# Patient Record
Sex: Male | Born: 1945 | Race: White | Hispanic: No | Marital: Married | Smoking: Former smoker
Health system: Southern US, Community
[De-identification: ages and names within clinical notes are randomized; demographics above are authoritative.]

## PROBLEM LIST (undated history)

## (undated) DIAGNOSIS — Z860100 Personal history of colon polyps, unspecified: Secondary | ICD-10-CM

## (undated) DIAGNOSIS — K219 Gastro-esophageal reflux disease without esophagitis: Secondary | ICD-10-CM

## (undated) DIAGNOSIS — E785 Hyperlipidemia, unspecified: Secondary | ICD-10-CM

## (undated) HISTORY — PX: LAPAROSCOPIC INGUINAL HERNIA REPAIR: SUR788

## (undated) HISTORY — PX: HERNIA REPAIR: SHX51

## (undated) HISTORY — DX: Hyperlipidemia, unspecified: E78.5

## (undated) HISTORY — PX: TOOTH EXTRACTION: SUR596

## (undated) HISTORY — PX: OTHER SURGICAL HISTORY: SHX169

## (undated) HISTORY — PX: CYST EXCISION: SHX5701

---

## 2008-10-01 ENCOUNTER — Ambulatory Visit: Payer: Self-pay | Admitting: Gastroenterology

## 2012-08-10 HISTORY — PX: COLONOSCOPY: SHX174

## 2014-01-19 DIAGNOSIS — Z86010 Personal history of colonic polyps: Secondary | ICD-10-CM | POA: Insufficient documentation

## 2014-02-13 ENCOUNTER — Ambulatory Visit: Payer: Self-pay | Admitting: Gastroenterology

## 2014-02-13 LAB — HM COLONOSCOPY

## 2014-02-14 LAB — PATHOLOGY REPORT

## 2014-03-27 LAB — LIPID PANEL
Cholesterol: 174 mg/dL (ref 0–200)
HDL: 85 mg/dL — AB (ref 35–70)
LDL Cholesterol: 77 mg/dL
Triglycerides: 62 mg/dL (ref 40–160)

## 2014-03-27 LAB — BASIC METABOLIC PANEL
BUN: 11 mg/dL (ref 4–21)
CREATININE: 1 mg/dL (ref <=1.3)
Glucose: 84 mg/dL

## 2014-03-27 LAB — PSA: PSA: 4.5

## 2014-05-16 DIAGNOSIS — E291 Testicular hypofunction: Secondary | ICD-10-CM | POA: Insufficient documentation

## 2014-05-16 DIAGNOSIS — N138 Other obstructive and reflux uropathy: Secondary | ICD-10-CM | POA: Insufficient documentation

## 2014-05-16 DIAGNOSIS — N401 Enlarged prostate with lower urinary tract symptoms: Secondary | ICD-10-CM | POA: Insufficient documentation

## 2014-12-21 DIAGNOSIS — Z0000 Encounter for general adult medical examination without abnormal findings: Secondary | ICD-10-CM | POA: Insufficient documentation

## 2014-12-21 DIAGNOSIS — R7401 Elevation of levels of liver transaminase levels: Secondary | ICD-10-CM | POA: Insufficient documentation

## 2014-12-21 DIAGNOSIS — R030 Elevated blood-pressure reading, without diagnosis of hypertension: Secondary | ICD-10-CM | POA: Insufficient documentation

## 2014-12-21 DIAGNOSIS — E785 Hyperlipidemia, unspecified: Secondary | ICD-10-CM | POA: Insufficient documentation

## 2014-12-21 DIAGNOSIS — R740 Nonspecific elevation of levels of transaminase and lactic acid dehydrogenase [LDH]: Secondary | ICD-10-CM

## 2014-12-21 DIAGNOSIS — N529 Male erectile dysfunction, unspecified: Secondary | ICD-10-CM | POA: Insufficient documentation

## 2015-03-29 ENCOUNTER — Ambulatory Visit (INDEPENDENT_AMBULATORY_CARE_PROVIDER_SITE_OTHER): Payer: Medicare Other | Admitting: Family Medicine

## 2015-03-29 ENCOUNTER — Encounter: Payer: Self-pay | Admitting: Family Medicine

## 2015-03-29 VITALS — BP 120/70 | HR 60 | Ht 72.0 in | Wt 197.0 lb

## 2015-03-29 DIAGNOSIS — Z0000 Encounter for general adult medical examination without abnormal findings: Secondary | ICD-10-CM | POA: Diagnosis not present

## 2015-03-29 DIAGNOSIS — R740 Nonspecific elevation of levels of transaminase and lactic acid dehydrogenase [LDH]: Secondary | ICD-10-CM | POA: Diagnosis not present

## 2015-03-29 DIAGNOSIS — J301 Allergic rhinitis due to pollen: Secondary | ICD-10-CM | POA: Diagnosis not present

## 2015-03-29 DIAGNOSIS — E785 Hyperlipidemia, unspecified: Secondary | ICD-10-CM

## 2015-03-29 DIAGNOSIS — R7402 Elevation of levels of lactic acid dehydrogenase (LDH): Secondary | ICD-10-CM

## 2015-03-29 DIAGNOSIS — R030 Elevated blood-pressure reading, without diagnosis of hypertension: Secondary | ICD-10-CM

## 2015-03-29 LAB — POCT URINALYSIS DIPSTICK
Bilirubin, UA: NEGATIVE
Blood, UA: NEGATIVE
GLUCOSE UA: NEGATIVE
Ketones, UA: NEGATIVE
Leukocytes, UA: NEGATIVE
NITRITE UA: NEGATIVE
PROTEIN UA: NEGATIVE
Spec Grav, UA: 1.01
UROBILINOGEN UA: 0.2
pH, UA: 6

## 2015-03-29 LAB — HEMOCCULT GUIAC POC 1CARD (OFFICE): FECAL OCCULT BLD: NEGATIVE

## 2015-03-29 MED ORDER — FISH OIL 1000 MG PO CAPS: 1.0000 | ORAL_CAPSULE | Freq: Every day | ORAL | Status: AC

## 2015-03-29 MED ORDER — FLUTICASONE PROPIONATE 50 MCG/ACT NA SUSP: 1.0000 | Freq: Every day | NASAL | Status: DC

## 2015-03-29 NOTE — Progress Notes (Signed)
Name: Richard Stafford   MRN: [id number]    DOB: 12/15/1945   Date:03/29/2015       Progress Note  Subjective  Chief Complaint  Chief Complaint  Patient presents with  . Annual Exam    question about Flonase    HPI Comments: No specific concerns objectively nor subjectly.     No problem-specific assessment & plan notes found for this encounter.   Past Medical History  Diagnosis Date  . Hyperlipidemia     Past Surgical History  Procedure Laterality Date  . Hernia repair    . Varicose veins    . Colonoscopy  2014    cleared for 5 years- [facility]    Family History  Problem Relation Age of Onset  . Cancer Father   . Heart disease Father   . Cancer Brother     Social History   Social History  . Marital Status: Married    Spouse Name: N/A  . Number of Children: N/A  . Years of Education: N/A   Occupational History  . Not on file.   Social History Main Topics  . Smoking status: Former Research scientist (life sciences)  . Smokeless tobacco: Not on file  . Alcohol Use: 0.0 oz/week    0 Standard drinks or equivalent per week  . Drug Use: No  . Sexual Activity: Yes   Other Topics Concern  . Not on file   Social History Narrative    No Known Allergies   Review of Systems  Constitutional: Negative for fever, chills, weight loss and malaise/fatigue.  HENT: Negative for ear discharge, ear pain and sore throat.   Eyes: Negative for blurred vision.  Respiratory: Negative for cough, sputum production, shortness of breath and wheezing.   Cardiovascular: Negative for chest pain, palpitations and leg swelling.  Gastrointestinal: Negative for heartburn, nausea, abdominal pain, diarrhea, constipation, blood in stool and melena.  Genitourinary: Negative for dysuria, urgency, frequency and hematuria.  Musculoskeletal: Negative for myalgias, back pain, joint pain and neck pain.  Skin: Negative for rash.  Neurological: Negative for dizziness, tingling, sensory change, focal weakness and headaches.   Endo/Heme/Allergies: Negative for environmental allergies and polydipsia. Does not bruise/bleed easily.  Psychiatric/Behavioral: Negative for depression and suicidal ideas. The patient is not nervous/anxious and does not have insomnia.      Objective  Filed Vitals:   03/29/15 0826  BP: 120/70  Pulse: 60  Height: 6' (1.829 m)  Weight: 197 lb (89.359 kg)    Physical Exam  Constitutional: He is oriented to person, place, and time and well-developed, well-nourished, and in no distress.  HENT:  Head: Normocephalic.  Right Ear: External ear normal.  Left Ear: External ear normal.  Nose: Nose normal.  Mouth/Throat: Oropharynx is clear and moist.  Eyes: Conjunctivae and EOM are normal. Pupils are equal, round, and reactive to light. Right eye exhibits no discharge. Left eye exhibits no discharge. No scleral icterus.  Neck: Normal range of motion. Neck supple. No JVD present. No tracheal deviation present. No thyromegaly present.  Cardiovascular: Normal rate, regular rhythm, normal heart sounds and intact distal pulses.  Exam reveals no gallop and no friction rub.   No murmur heard. Pulmonary/Chest: Breath sounds normal. No respiratory distress. He has no wheezes. He has no rales.  Abdominal: Soft. Bowel sounds are normal. He exhibits no mass. There is no hepatosplenomegaly. There is no tenderness. There is no rebound, no guarding and no CVA tenderness.  Genitourinary: Rectum normal and prostate normal. Guaiac negative stool.  Musculoskeletal: Normal range of motion. He exhibits no edema or tenderness.  Lymphadenopathy:    He has no cervical adenopathy.  Neurological: He is alert and oriented to person, place, and time. He has normal sensation, normal strength, normal reflexes and intact cranial nerves. No cranial nerve deficit.  Skin: Skin is warm. No rash noted.  Psychiatric: Mood and affect normal.      Assessment & Plan  Problem List Items Addressed This Visit    None     Visit Diagnoses    Annual physical exam    -  Primary    Allergic rhinitis due to pollen             Dr. Otilio Miu [facility]  03/29/2015

## 2015-03-30 LAB — RENAL FUNCTION PANEL
ALBUMIN: 4.8 g/dL (ref 3.6–4.8)
BUN / CREAT RATIO: 11 (ref 10–22)
BUN: 11 mg/dL (ref 8–27)
CALCIUM: 9.3 mg/dL (ref 8.6–10.2)
CO2: 25 mmol/L (ref 18–29)
CREATININE: 1.01 mg/dL (ref 0.76–1.27)
Chloride: 96 mmol/L — ABNORMAL LOW (ref 97–108)
GFR calc Af Amer: 89 mL/min/{1.73_m2} (ref >59)
GFR calc non Af Amer: 77 mL/min/{1.73_m2} (ref >59)
Glucose: 71 mg/dL (ref 65–99)
Phosphorus: 3.6 mg/dL (ref 2.5–4.5)
Potassium: 4.5 mmol/L (ref 3.5–5.2)
Sodium: 141 mmol/L (ref 134–144)

## 2015-03-30 LAB — LIPID PANEL
CHOLESTEROL TOTAL: 166 mg/dL (ref 100–199)
Chol/HDL Ratio: 1.9 ratio units (ref 0.0–5.0)
HDL: 89 mg/dL (ref >39)
LDL CALC: 65 mg/dL (ref 0–99)
TRIGLYCERIDES: 60 mg/dL (ref 0–149)
VLDL CHOLESTEROL CAL: 12 mg/dL (ref 5–40)

## 2015-05-17 DIAGNOSIS — R339 Retention of urine, unspecified: Secondary | ICD-10-CM | POA: Insufficient documentation

## 2015-07-01 ENCOUNTER — Ambulatory Visit (INDEPENDENT_AMBULATORY_CARE_PROVIDER_SITE_OTHER): Payer: Medicare Other | Admitting: Family Medicine

## 2015-07-01 ENCOUNTER — Encounter: Payer: Self-pay | Admitting: Family Medicine

## 2015-07-01 VITALS — BP 120/68 | HR 64 | Ht 72.0 in | Wt 208.0 lb

## 2015-07-01 DIAGNOSIS — S46112A Strain of muscle, fascia and tendon of long head of biceps, left arm, initial encounter: Secondary | ICD-10-CM | POA: Diagnosis not present

## 2015-07-01 NOTE — Progress Notes (Signed)
Name: Richard Stafford   MRN: [id number]    DOB: [birth year]   Date:07/01/2015       Progress Note  Subjective  Chief Complaint  Chief Complaint  Patient presents with  . Fall    fell approx 11 days ago on ship- arm pain when trying to do things such as twist the arm or put shoes on    Fall The accident occurred more than 1 week ago. The fall occurred while standing (slippage of arm). He landed on hard floor. There was no blood loss. The point of impact was the left elbow. The pain is present in the left elbow and left upper arm. The pain is severe. The symptoms are aggravated by flexion and rotation. Pertinent negatives include no abdominal pain, fever, headaches, hematuria, nausea, numbness or tingling. Associated symptoms comments: Weakness noted. He has tried NSAID and immobilization for the symptoms. The treatment provided no relief.    No problem-specific assessment & plan notes found for this encounter.   Past Medical History  Diagnosis Date  . Hyperlipidemia     Past Surgical History  Procedure Laterality Date  . Hernia repair    . Varicose veins    . Colonoscopy  2014    cleared for 5 years- [facility]    Family History  Problem Relation Age of Onset  . Cancer Father   . Heart disease Father   . Cancer Brother     Social History   Social History  . Marital Status: Married    Spouse Name: N/A  . Number of Children: N/A  . Years of Education: N/A   Occupational History  . Not on file.   Social History Main Topics  . Smoking status: Former Research scientist (life sciences)  . Smokeless tobacco: Not on file  . Alcohol Use: 0.0 oz/week    0 Standard drinks or equivalent per week  . Drug Use: No  . Sexual Activity: Yes   Other Topics Concern  . Not on file   Social History Narrative    No Known Allergies   Review of Systems  Constitutional: Negative for fever, chills, weight loss and malaise/fatigue.  HENT: Negative for ear discharge, ear pain and sore throat.   Eyes: Negative  for blurred vision.  Respiratory: Negative for cough, sputum production, shortness of breath and wheezing.   Cardiovascular: Negative for chest pain, palpitations and leg swelling.  Gastrointestinal: Negative for heartburn, nausea, abdominal pain, diarrhea, constipation, blood in stool and melena.  Genitourinary: Negative for dysuria, urgency, frequency and hematuria.  Musculoskeletal: Negative for myalgias, back pain, joint pain and neck pain.  Skin: Negative for rash.  Neurological: Positive for focal weakness. Negative for dizziness, tingling, sensory change, numbness and headaches.  Endo/Heme/Allergies: Negative for environmental allergies and polydipsia. Does not bruise/bleed easily.  Psychiatric/Behavioral: Negative for depression and suicidal ideas. The patient is not nervous/anxious and does not have insomnia.      Objective  Filed Vitals:   07/01/15 0759  BP: 120/68  Pulse: 64  Height: 6' (1.829 m)  Weight: 208 lb (94.348 kg)    Physical Exam  Constitutional: He is oriented to person, place, and time and well-developed, well-nourished, and in no distress.  HENT:  Head: Normocephalic.  Right Ear: External ear normal.  Left Ear: External ear normal.  Nose: Nose normal.  Mouth/Throat: Oropharynx is clear and moist.  Eyes: Conjunctivae and EOM are normal. Pupils are equal, round, and reactive to light. Right eye exhibits no discharge. Left eye exhibits  no discharge. No scleral icterus.  Neck: Normal range of motion. Neck supple. No JVD present. No tracheal deviation present. No thyromegaly present.  Cardiovascular: Normal rate, regular rhythm, normal heart sounds and intact distal pulses.  Exam reveals no gallop and no friction rub.   No murmur heard. Pulmonary/Chest: Breath sounds normal. No respiratory distress. He has no wheezes. He has no rales.  Abdominal: Soft. Bowel sounds are normal. He exhibits no mass. There is no hepatosplenomegaly. There is no tenderness. There is  no rebound, no guarding and no CVA tenderness.  Musculoskeletal: Normal range of motion. He exhibits no edema.       Left elbow: He exhibits deformity. Tenderness found.       Arms: Lymphadenopathy:    He has no cervical adenopathy.  Neurological: He is alert and oriented to person, place, and time. He has normal sensation, normal reflexes and intact cranial nerves. He displays weakness. No cranial nerve deficit.  Decrease flexion/supination left / defect noted left bicep  Skin: Skin is warm. No rash noted.  Psychiatric: Mood and affect normal.  Nursing note and vitals reviewed.     Assessment & Plan  Problem List Items Addressed This Visit    None    Visit Diagnoses    Biceps tendon tear, left, initial encounter    -  Primary    Relevant Orders    Ambulatory referral to Orthopedic Surgery         Dr. Otilio Miu [facility] [place]  07/01/2015

## 2016-01-21 DIAGNOSIS — H2513 Age-related nuclear cataract, bilateral: Secondary | ICD-10-CM | POA: Diagnosis not present

## 2016-03-30 ENCOUNTER — Encounter: Payer: Medicare Other | Admitting: Family Medicine

## 2016-04-01 ENCOUNTER — Ambulatory Visit (INDEPENDENT_AMBULATORY_CARE_PROVIDER_SITE_OTHER): Payer: Medicare Other | Admitting: Family Medicine

## 2016-04-01 ENCOUNTER — Encounter: Payer: Self-pay | Admitting: Family Medicine

## 2016-04-01 VITALS — BP 120/80 | HR 64 | Ht 72.0 in | Wt 212.0 lb

## 2016-04-01 DIAGNOSIS — E669 Obesity, unspecified: Secondary | ICD-10-CM | POA: Diagnosis not present

## 2016-04-01 DIAGNOSIS — Z0000 Encounter for general adult medical examination without abnormal findings: Secondary | ICD-10-CM | POA: Diagnosis not present

## 2016-04-01 DIAGNOSIS — N400 Enlarged prostate without lower urinary tract symptoms: Secondary | ICD-10-CM

## 2016-04-01 DIAGNOSIS — R351 Nocturia: Secondary | ICD-10-CM

## 2016-04-01 LAB — POCT URINALYSIS DIPSTICK
BILIRUBIN UA: NEGATIVE
Blood, UA: NEGATIVE
Glucose, UA: NEGATIVE
KETONES UA: NEGATIVE
LEUKOCYTES UA: NEGATIVE
Nitrite, UA: NEGATIVE
PROTEIN UA: NEGATIVE
Spec Grav, UA: 1.01
Urobilinogen, UA: 0.2
pH, UA: 5

## 2016-04-01 NOTE — Progress Notes (Signed)
Name: Richard Stafford   MRN: [id number]    DOB: 1946-01-20   Date:04/01/2016       Progress Note  Subjective  Chief Complaint  Chief Complaint  Patient presents with  . Annual Exam    Patient presents for physical exam.    No problem-specific Assessment & Plan notes found for this encounter.   Past Medical History:  Diagnosis Date  . Hyperlipidemia     Past Surgical History:  Procedure Laterality Date  . COLONOSCOPY  2014   cleared for 5 years- [facility] docs  . HERNIA REPAIR    . varicose veins      Family History  Problem Relation Age of Onset  . Cancer Father   . Heart disease Father   . Cancer Brother     Social History   Social History  . Marital status: Married    Spouse name: N/A  . Number of children: N/A  . Years of education: N/A   Occupational History  . Not on file.   Social History Main Topics  . Smoking status: Former Research scientist (life sciences)  . Smokeless tobacco: Not on file  . Alcohol use 0.0 oz/week  . Drug use: No  . Sexual activity: Yes   Other Topics Concern  . Not on file   Social History Narrative  . No narrative on file    No Known Allergies   Review of Systems  Constitutional: Negative for chills, fever, malaise/fatigue and weight loss.  HENT: Negative for ear discharge, ear pain and sore throat.   Eyes: Negative for blurred vision.  Respiratory: Negative for cough, sputum production, shortness of breath and wheezing.   Cardiovascular: Negative for chest pain, palpitations and leg swelling.  Gastrointestinal: Negative for abdominal pain, blood in stool, constipation, diarrhea, heartburn, melena and nausea.  Genitourinary: Negative for dysuria, frequency, hematuria and urgency.  Musculoskeletal: Negative for back pain, joint pain, myalgias and neck pain.  Skin: Negative for rash.  Neurological: Negative for dizziness, tingling, sensory change, focal weakness and headaches.  Endo/Heme/Allergies: Negative for environmental allergies and  polydipsia. Does not bruise/bleed easily.  Psychiatric/Behavioral: Negative for depression and suicidal ideas. The patient is not nervous/anxious and does not have insomnia.      Objective  Vitals:   04/01/16 0824  BP: 120/80  Pulse: 64  Weight: 212 lb (96.2 kg)  Height: 6' (1.829 m)    Physical Exam  Constitutional: He is oriented to person, place, and time and well-developed, well-nourished, and in no distress.  HENT:  Head: Normocephalic.  Right Ear: External ear normal.  Left Ear: External ear normal.  Nose: Nose normal.  Mouth/Throat: Oropharynx is clear and moist.  Eyes: Conjunctivae and EOM are normal. Pupils are equal, round, and reactive to light. Right eye exhibits no discharge. Left eye exhibits no discharge. No scleral icterus.  Neck: Normal range of motion. Neck supple. No JVD present. No tracheal deviation present. No thyromegaly present.  Cardiovascular: Normal rate, regular rhythm, S1 normal, S2 normal, normal heart sounds and intact distal pulses.  Exam reveals no gallop, no S3, no S4 and no friction rub.   No murmur heard. Pulmonary/Chest: Breath sounds normal. No respiratory distress. He has no wheezes. He has no rales. Right breast exhibits no mass. Left breast exhibits no mass.  Abdominal: Soft. Bowel sounds are normal. He exhibits no mass. There is no hepatosplenomegaly. There is no tenderness. There is no rebound, no guarding and no CVA tenderness.  Genitourinary: Rectum normal, prostate normal, testes/scrotum normal  and penis normal.  Musculoskeletal: Normal range of motion. He exhibits no edema or tenderness.  Lymphadenopathy:    He has no cervical adenopathy.  Neurological: He is alert and oriented to person, place, and time. He has normal sensation, normal strength, normal reflexes and intact cranial nerves. No cranial nerve deficit.  Skin: Skin is warm. No rash noted.  Psychiatric: Mood and affect normal.  Nursing note and vitals  reviewed.     Assessment & Plan  Problem List Items Addressed This Visit    None    Visit Diagnoses    Annual physical exam    -  Primary   Relevant Orders   Basic metabolic panel   Lipid Profile   PSA   Nocturia       Relevant Orders   PSA   POCT urinalysis dipstick (Completed)   Obesity       Relevant Orders   Lipid Profile   BPH (benign prostatic hyperplasia)            Dr. Otilio Miu [facility]  04/01/16

## 2016-04-02 LAB — LIPID PANEL
CHOL/HDL RATIO: 2.3 ratio (ref 0.0–5.0)
Cholesterol, Total: 147 mg/dL (ref 100–199)
HDL: 63 mg/dL (ref >39)
LDL CALC: 63 mg/dL (ref 0–99)
Triglycerides: 106 mg/dL (ref 0–149)
VLDL CHOLESTEROL CAL: 21 mg/dL (ref 5–40)

## 2016-04-02 LAB — BASIC METABOLIC PANEL
BUN/Creatinine Ratio: 11 (ref 10–24)
BUN: 11 mg/dL (ref 8–27)
CALCIUM: 9 mg/dL (ref 8.6–10.2)
CO2: 26 mmol/L (ref 18–29)
Chloride: 101 mmol/L (ref 96–106)
Creatinine, Ser: 1.01 mg/dL (ref 0.76–1.27)
GFR, EST AFRICAN AMERICAN: 88 mL/min/{1.73_m2} (ref >59)
GFR, EST NON AFRICAN AMERICAN: 76 mL/min/{1.73_m2} (ref >59)
Glucose: 76 mg/dL (ref 65–99)
POTASSIUM: 5 mmol/L (ref 3.5–5.2)
Sodium: 142 mmol/L (ref 134–144)

## 2016-04-02 LAB — PSA: PROSTATE SPECIFIC AG, SERUM: 5 ng/mL — AB (ref 0.0–4.0)

## 2016-04-10 DIAGNOSIS — N138 Other obstructive and reflux uropathy: Secondary | ICD-10-CM | POA: Diagnosis not present

## 2016-04-10 DIAGNOSIS — E291 Testicular hypofunction: Secondary | ICD-10-CM | POA: Diagnosis not present

## 2016-04-10 DIAGNOSIS — R972 Elevated prostate specific antigen [PSA]: Secondary | ICD-10-CM | POA: Diagnosis not present

## 2016-04-10 DIAGNOSIS — Z6825 Body mass index (BMI) 25.0-25.9, adult: Secondary | ICD-10-CM | POA: Diagnosis not present

## 2016-04-10 DIAGNOSIS — N401 Enlarged prostate with lower urinary tract symptoms: Secondary | ICD-10-CM | POA: Diagnosis not present

## 2016-04-10 DIAGNOSIS — R339 Retention of urine, unspecified: Secondary | ICD-10-CM | POA: Diagnosis not present

## 2016-05-06 DIAGNOSIS — Z23 Encounter for immunization: Secondary | ICD-10-CM | POA: Diagnosis not present

## 2016-05-07 ENCOUNTER — Other Ambulatory Visit: Payer: Self-pay

## 2016-06-30 DIAGNOSIS — N4231 Prostatic intraepithelial neoplasia: Secondary | ICD-10-CM | POA: Diagnosis not present

## 2016-06-30 DIAGNOSIS — R972 Elevated prostate specific antigen [PSA]: Secondary | ICD-10-CM | POA: Diagnosis not present

## 2016-07-08 DIAGNOSIS — Z6825 Body mass index (BMI) 25.0-25.9, adult: Secondary | ICD-10-CM | POA: Diagnosis not present

## 2016-07-08 DIAGNOSIS — D075 Carcinoma in situ of prostate: Secondary | ICD-10-CM | POA: Insufficient documentation

## 2016-07-08 DIAGNOSIS — R972 Elevated prostate specific antigen [PSA]: Secondary | ICD-10-CM | POA: Diagnosis not present

## 2016-07-08 DIAGNOSIS — N138 Other obstructive and reflux uropathy: Secondary | ICD-10-CM | POA: Diagnosis not present

## 2016-07-08 DIAGNOSIS — N401 Enlarged prostate with lower urinary tract symptoms: Secondary | ICD-10-CM | POA: Diagnosis not present

## 2016-08-11 ENCOUNTER — Telehealth: Payer: Self-pay

## 2016-08-11 NOTE — Telephone Encounter (Signed)
Pt called to ask for [employer] to take on vacation with him- was told we could do it this time, but would not be able to continue due to this not being good medicine practice

## 2016-08-14 ENCOUNTER — Other Ambulatory Visit: Payer: Self-pay

## 2016-08-14 DIAGNOSIS — R05 Cough: Secondary | ICD-10-CM

## 2016-08-14 DIAGNOSIS — R059 Cough, unspecified: Secondary | ICD-10-CM

## 2016-08-14 MED ORDER — AZITHROMYCIN 250 MG PO TABS: 250.0000 mg | ORAL_TABLET | Freq: Every day | ORAL | 0 refills | Status: DC

## 2016-10-07 DIAGNOSIS — R339 Retention of urine, unspecified: Secondary | ICD-10-CM | POA: Diagnosis not present

## 2016-10-07 DIAGNOSIS — Z6825 Body mass index (BMI) 25.0-25.9, adult: Secondary | ICD-10-CM | POA: Diagnosis not present

## 2016-10-07 DIAGNOSIS — R972 Elevated prostate specific antigen [PSA]: Secondary | ICD-10-CM | POA: Diagnosis not present

## 2016-10-07 DIAGNOSIS — N401 Enlarged prostate with lower urinary tract symptoms: Secondary | ICD-10-CM | POA: Diagnosis not present

## 2016-10-07 DIAGNOSIS — D075 Carcinoma in situ of prostate: Secondary | ICD-10-CM | POA: Diagnosis not present

## 2016-10-07 DIAGNOSIS — N138 Other obstructive and reflux uropathy: Secondary | ICD-10-CM | POA: Diagnosis not present

## 2017-02-05 DIAGNOSIS — R339 Retention of urine, unspecified: Secondary | ICD-10-CM | POA: Diagnosis not present

## 2017-02-05 DIAGNOSIS — Z6825 Body mass index (BMI) 25.0-25.9, adult: Secondary | ICD-10-CM | POA: Diagnosis not present

## 2017-02-05 DIAGNOSIS — R972 Elevated prostate specific antigen [PSA]: Secondary | ICD-10-CM | POA: Diagnosis not present

## 2017-02-05 DIAGNOSIS — N138 Other obstructive and reflux uropathy: Secondary | ICD-10-CM | POA: Diagnosis not present

## 2017-02-05 DIAGNOSIS — D075 Carcinoma in situ of prostate: Secondary | ICD-10-CM | POA: Diagnosis not present

## 2017-02-05 DIAGNOSIS — N401 Enlarged prostate with lower urinary tract symptoms: Secondary | ICD-10-CM | POA: Diagnosis not present

## 2017-04-02 ENCOUNTER — Ambulatory Visit (INDEPENDENT_AMBULATORY_CARE_PROVIDER_SITE_OTHER): Payer: Medicare Other | Admitting: Family Medicine

## 2017-04-02 ENCOUNTER — Encounter: Payer: Self-pay | Admitting: Family Medicine

## 2017-04-02 VITALS — BP 124/72 | HR 64 | Ht 72.0 in | Wt 233.0 lb

## 2017-04-02 DIAGNOSIS — Z1159 Encounter for screening for other viral diseases: Secondary | ICD-10-CM | POA: Insufficient documentation

## 2017-04-02 DIAGNOSIS — S90512A Abrasion, left ankle, initial encounter: Secondary | ICD-10-CM | POA: Diagnosis not present

## 2017-04-02 DIAGNOSIS — E785 Hyperlipidemia, unspecified: Secondary | ICD-10-CM

## 2017-04-02 DIAGNOSIS — Z0000 Encounter for general adult medical examination without abnormal findings: Secondary | ICD-10-CM | POA: Diagnosis not present

## 2017-04-02 DIAGNOSIS — Z23 Encounter for immunization: Secondary | ICD-10-CM

## 2017-04-02 DIAGNOSIS — H6123 Impacted cerumen, bilateral: Secondary | ICD-10-CM

## 2017-04-02 NOTE — Progress Notes (Signed)
Name: Richard Stafford   MRN: [id number]    DOB: Dec 10, 1945   Date:04/02/2017       Progress Note  Subjective  Chief Complaint  Chief Complaint  Patient presents with  . Annual Exam    needs TDAP and Prevnar 13    Patient presents for annual physical exam.   Rash  This is a new problem. The current episode started in the past 7 days. The problem is unchanged. The affected locations include the left ankle. The rash is characterized by redness. Pertinent negatives include no cough, diarrhea, fever, joint pain, shortness of breath or sore throat.    No problem-specific Assessment & Plan notes found for this encounter.   Past Medical History:  Diagnosis Date  . Hyperlipidemia     Past Surgical History:  Procedure Laterality Date  . COLONOSCOPY  2014   cleared for 5 years- [facility] docs  . HERNIA REPAIR    . varicose veins      Family History  Problem Relation Age of Onset  . Cancer Father   . Heart disease Father   . Cancer Brother     Social History   Social History  . Marital status: Married    Spouse name: N/A  . Number of children: N/A  . Years of education: N/A   Occupational History  . Not on file.   Social History Main Topics  . Smoking status: Former Research scientist (life sciences)  . Smokeless tobacco: Never Used  . Alcohol use 0.0 oz/week  . Drug use: No  . Sexual activity: Yes   Other Topics Concern  . Not on file   Social History Narrative  . No narrative on file    No Known Allergies  Outpatient Medications Prior to Visit  Medication Sig Dispense Refill  . Ascorbic Acid (VITAMIN C) 500 MG CAPS Take 1 capsule by mouth daily.    Marland Kitchen aspirin 81 MG tablet Take 1 tablet by mouth daily.    . fluticasone (FLONASE) 50 MCG/ACT nasal spray Place 1 spray into both nostrils daily. 1 g 11  . Glucosamine 500 MG CAPS Take 2 capsules by mouth once.     . Multiple Vitamins-Minerals (MENS MULTIVITAMIN PLUS PO) Take 1 tablet by mouth daily.    . Omega-3 Fatty Acids (FISH OIL) 1000 MG CAPS  Take 1 capsule (1,000 mg total) by mouth daily. 30 capsule 11  . azithromycin (ZITHROMAX) 250 MG tablet Take 1 tablet (250 mg total) by mouth daily. Take 2 tabs on day one at once and then take 1 daily until finished. 6 tablet 0   No facility-administered medications prior to visit.     Review of Systems  Constitutional: Negative for chills, fever, malaise/fatigue and weight loss.  HENT: Negative for ear discharge, ear pain and sore throat.   Eyes: Negative for blurred vision.  Respiratory: Negative for cough, sputum production, shortness of breath and wheezing.   Cardiovascular: Negative for chest pain, palpitations and leg swelling.  Gastrointestinal: Negative for abdominal pain, blood in stool, constipation, diarrhea, heartburn, melena and nausea.  Genitourinary: Negative for dysuria, frequency, hematuria and urgency.  Musculoskeletal: Negative for back pain, joint pain, myalgias and neck pain.  Skin: Negative for rash.  Neurological: Negative for dizziness, tingling, sensory change, focal weakness and headaches.  Endo/Heme/Allergies: Negative for environmental allergies and polydipsia. Does not bruise/bleed easily.  Psychiatric/Behavioral: Negative for depression and suicidal ideas. The patient is not nervous/anxious and does not have insomnia.      Objective  Vitals:   04/02/17 0823  BP: 124/72  Pulse: 64  Weight: 233 lb (105.7 kg)  Height: 6' (1.829 m)    Physical Exam  Constitutional: He is oriented to person, place, and time and well-developed, well-nourished, and in no distress. Vital signs are normal.  HENT:  Head: Normocephalic.  Right Ear: External ear normal.  Left Ear: External ear normal.  Nose: Nose normal.  Mouth/Throat: Uvula is midline, oropharynx is clear and moist and mucous membranes are normal. No oropharyngeal exudate, posterior oropharyngeal edema or posterior oropharyngeal erythema.  Eyes: Pupils are equal, round, and reactive to light. Conjunctivae  and EOM are normal. Right eye exhibits no discharge. Left eye exhibits no discharge. No scleral icterus.  Fundoscopic exam:      The right eye shows no arteriolar narrowing and no AV nicking.       The left eye shows no arteriolar narrowing and no AV nicking.  Neck: Trachea normal and normal range of motion. Neck supple. Normal carotid pulses, no hepatojugular reflux and no JVD present. Carotid bruit is not present. No tracheal deviation present. No thyroid mass and no thyromegaly present.  Cardiovascular: Normal rate, regular rhythm, S1 normal, normal heart sounds and intact distal pulses.  PMI is not displaced.  Exam reveals no gallop, no S3, no S4, no distant heart sounds and no friction rub.   No murmur heard. Pulmonary/Chest: Breath sounds normal. No respiratory distress. He has no wheezes. He has no rales. Right breast exhibits no mass. Left breast exhibits no mass.  Abdominal: Soft. Normal aorta and bowel sounds are normal. He exhibits no mass. There is no hepatosplenomegaly. There is no tenderness. There is no rebound, no guarding and no CVA tenderness.  Genitourinary: Rectum normal, prostate normal and testes/scrotum normal. Rectal exam shows no external hemorrhoid and guaiac negative stool. No discharge found.  Musculoskeletal: Normal range of motion. He exhibits no edema or tenderness.       Left elbow: He exhibits deformity.       Cervical back: Normal.       Thoracic back: Normal.       Lumbar back: Normal.  Lymphadenopathy:       Head (right side): No submandibular adenopathy present.       Head (left side): No submandibular adenopathy present.    He has no cervical adenopathy.  Neurological: He is alert and oriented to person, place, and time. He has normal sensation, normal strength, normal reflexes and intact cranial nerves. No cranial nerve deficit.  Skin: Skin is warm and intact. No rash noted.  abasion trauma  Psychiatric: Mood and affect normal.  Nursing note and vitals  reviewed.     Assessment & Plan  Problem List Items Addressed This Visit      Other   HLD (hyperlipidemia)   Relevant Medications   sildenafil (REVATIO) 20 MG tablet   Other Relevant Orders   Lipid Profile   Need for hepatitis C screening test   Relevant Orders   Hepatitis C antibody    Other Visit Diagnoses    Annual physical exam    -  Primary   Relevant Orders   Renal Function Panel   Need for vaccination with 13-polyvalent pneumococcal conjugate vaccine       Relevant Orders   Pneumococcal conjugate vaccine 13-valent IM (Completed)   Abrasion of left ankle, initial encounter       Need for diphtheria-tetanus-pertussis (Tdap) vaccine       Relevant Orders  Tdap vaccine greater than or equal to 7yo IM (Completed)   Bilateral impacted cerumen          No orders of the defined types were placed in this encounter.     Dr. Mac[facility]  04/02/17

## 2017-04-03 LAB — RENAL FUNCTION PANEL
ALBUMIN: 4.4 g/dL (ref 3.6–4.8)
BUN/Creatinine Ratio: 16 (ref 10–24)
BUN: 16 mg/dL (ref 8–27)
CHLORIDE: 99 mmol/L (ref 96–106)
CO2: 24 mmol/L (ref 20–29)
CREATININE: 1.02 mg/dL (ref 0.76–1.27)
Calcium: 9.3 mg/dL (ref 8.6–10.2)
GFR, EST AFRICAN AMERICAN: 86 mL/min/{1.73_m2} (ref >59)
GFR, EST NON AFRICAN AMERICAN: 75 mL/min/{1.73_m2} (ref >59)
Glucose: 93 mg/dL (ref 65–99)
Phosphorus: 3.6 mg/dL (ref 2.5–4.5)
Potassium: 4.9 mmol/L (ref 3.5–5.2)
Sodium: 138 mmol/L (ref 134–144)

## 2017-04-03 LAB — LIPID PANEL
Chol/HDL Ratio: 2.8 ratio (ref 0.0–5.0)
Cholesterol, Total: 163 mg/dL (ref 100–199)
HDL: 58 mg/dL (ref >39)
LDL Calculated: 93 mg/dL (ref 0–99)
Triglycerides: 60 mg/dL (ref 0–149)
VLDL CHOLESTEROL CAL: 12 mg/dL (ref 5–40)

## 2017-04-03 LAB — HEPATITIS C ANTIBODY

## 2017-04-19 DIAGNOSIS — J341 Cyst and mucocele of nose and nasal sinus: Secondary | ICD-10-CM | POA: Diagnosis not present

## 2017-06-03 DIAGNOSIS — Z23 Encounter for immunization: Secondary | ICD-10-CM | POA: Diagnosis not present

## 2017-06-24 DIAGNOSIS — D3131 Benign neoplasm of right choroid: Secondary | ICD-10-CM | POA: Diagnosis not present

## 2017-10-20 ENCOUNTER — Ambulatory Visit: Payer: Medicare Other | Admitting: Family Medicine

## 2017-10-20 ENCOUNTER — Telehealth: Payer: Self-pay

## 2017-10-20 ENCOUNTER — Encounter: Payer: Self-pay | Admitting: Family Medicine

## 2017-10-20 VITALS — BP 130/82 | HR 64 | Ht 72.0 in | Wt 241.0 lb

## 2017-10-20 DIAGNOSIS — D229 Melanocytic nevi, unspecified: Secondary | ICD-10-CM

## 2017-10-20 DIAGNOSIS — J219 Acute bronchiolitis, unspecified: Secondary | ICD-10-CM | POA: Diagnosis not present

## 2017-10-20 MED ORDER — DOXYCYCLINE HYCLATE 100 MG PO TABS: 100.0000 mg | ORAL_TABLET | Freq: Two times a day (BID) | ORAL | 0 refills | Status: DC

## 2017-10-20 NOTE — Telephone Encounter (Signed)
Called pt to sched AWV w/ [facility]. Scheduled to be seen 10/21/17.

## 2017-10-20 NOTE — Progress Notes (Signed)
Name: Richard Stafford   MRN: [id number]    DOB: September 28, 1945   Date:10/20/2017       Progress Note  Subjective  Chief Complaint  Chief Complaint  Patient presents with  . Nevus    located on L) collar bone x 1 month- dark gray, gotten bigger. 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 Patient has noted a change in size (vertical and circumferencial.)   Cough  This is a new (on cruise) problem. The current episode started 1 to 4 weeks ago. The problem has been gradually worsening. The cough is productive of purulent sputum (thick/cream). Associated symptoms include nasal congestion and postnasal drip. Pertinent negatives include no chest pain, chills, ear congestion, ear pain, fever, headaches, heartburn, hemoptysis, myalgias, rash, rhinorrhea, sore throat, shortness of breath, sweats, weight loss or wheezing. The symptoms are aggravated by pollens. The treatment provided moderate relief. There is no history of COPD, emphysema or environmental allergies.    No problem-specific Assessment & Plan notes found for this encounter.   Past Medical History:  Diagnosis Date  . Hyperlipidemia     Past Surgical History:  Procedure Laterality Date  . COLONOSCOPY  2014   cleared for 5 years- [facility] docs  . HERNIA REPAIR    . varicose veins      Family History  Problem Relation Age of Onset  . Cancer Father   . Heart disease Father   . Cancer Brother     Social History   Socioeconomic History  . Marital status: Married    Spouse name: Not on file  . Number of children: Not on file  . Years of education: Not on file  . Highest education level: Not on file  Social Needs  . Financial resource strain: Not on file  . Food insecurity - worry: Not on file  . Food insecurity - inability: Not on file  . Transportation needs - medical: Not on file  . Transportation needs - non-medical: Not on file  Occupational History  . Not on file  Tobacco Use  .  Smoking status: Former Research scientist (life sciences)  . Smokeless tobacco: Never Used  Substance and Sexual Activity  . Alcohol use: Yes    Alcohol/week: 0.0 oz  . Drug use: No  . Sexual activity: Yes  Other Topics Concern  . Not on file  Social History Narrative  . Not on file    No Known Allergies  Outpatient Medications Prior to Visit  Medication Sig Dispense Refill  . Ascorbic Acid (VITAMIN C) 500 MG CAPS Take 1 capsule by mouth daily.    Marland Kitchen aspirin 81 MG tablet Take 1 tablet by mouth daily.    . finasteride (PROSCAR) 5 MG tablet Take 5 mg by mouth daily. [facility]/ [facility]    . fluticasone (FLONASE) 50 MCG/ACT nasal spray Place 1 spray into both nostrils daily. 1 g 11  . Glucosamine 500 MG CAPS Take 2 capsules by mouth once.     . Multiple Vitamins-Minerals (MENS MULTIVITAMIN PLUS PO) Take 1 tablet by mouth daily.    . Omega-3 Fatty Acids (FISH OIL) 1000 MG CAPS Take 1 capsule (1,000 mg total) by mouth daily. 30 capsule 11  . sildenafil (REVATIO) 20 MG tablet Urology/ [facility]     No facility-administered medications prior to visit.     Review of Systems  Constitutional: Negative for chills, fever, malaise/fatigue and weight loss.  HENT: Positive for postnasal drip. Negative  for ear discharge, ear pain, rhinorrhea and sore throat.   Eyes: Negative for blurred vision.  Respiratory: Negative for cough, hemoptysis, sputum production, shortness of breath and wheezing.   Cardiovascular: Negative for chest pain, palpitations and leg swelling.  Gastrointestinal: Negative for abdominal pain, blood in stool, constipation, diarrhea, heartburn, melena and nausea.  Genitourinary: Negative for dysuria, frequency, hematuria and urgency.  Musculoskeletal: Negative for back pain, joint pain, myalgias and neck pain.  Skin: Negative for rash.  Neurological: Negative for dizziness, tingling, sensory change, focal weakness and headaches.  Endo/Heme/Allergies: Negative for environmental allergies and polydipsia. Does not  bruise/bleed easily.  Psychiatric/Behavioral: Negative for depression and suicidal ideas. The patient is not nervous/anxious and does not have insomnia.      Objective  Vitals:   10/20/17 1111  BP: 130/82  Pulse: 64  Weight: 241 lb (109.3 kg)  Height: 6' (1.829 m)    Physical Exam  Constitutional: He is oriented to person, place, and time and well-developed, well-nourished, and in no distress.  HENT:  Head: Normocephalic.  Right Ear: External ear normal.  Left Ear: External ear normal.  Nose: Nose normal.  Mouth/Throat: Oropharynx is clear and moist.  Eyes: Conjunctivae and EOM are normal. Pupils are equal, round, and reactive to light. Right eye exhibits no discharge. Left eye exhibits no discharge. No scleral icterus.  Neck: Normal range of motion. Neck supple. No JVD present. No tracheal deviation present. No thyromegaly present.  Cardiovascular: Normal rate, regular rhythm, normal heart sounds and intact distal pulses. Exam reveals no gallop and no friction rub.  No murmur heard. Pulmonary/Chest: Breath sounds normal. No respiratory distress. He has no wheezes. He has no rales.  Abdominal: Soft. Bowel sounds are normal. He exhibits no mass. There is no hepatosplenomegaly. There is no tenderness. There is no rebound, no guarding and no CVA tenderness.  Musculoskeletal: Normal range of motion. He exhibits no edema or tenderness.  Lymphadenopathy:    He has no cervical adenopathy.  Neurological: He is alert and oriented to person, place, and time. He has normal sensation, normal strength, normal reflexes and intact cranial nerves. No cranial nerve deficit.  Skin: Skin is warm. No rash noted.  Maculopapular pigmented with irregular border  Psychiatric: Mood and affect normal.  Nursing note and vitals reviewed.     Assessment & Plan  Problem List Items Addressed This Visit    None    Visit Diagnoses    Bronchiolitis    -  Primary   partially treated   Relevant  Medications   doxycycline (VIBRA-TABS) 100 MG tablet   Nevus       ? 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 doxycycline (VIBRA-TABS) 100 MG tablet    Sig: Take 1 tablet (100 mg total) by mouth 2 (two) times daily.    Dispense:  20 tablet    Refill:  0      Dr. Otilio Miu [facility]  10/20/17

## 2017-10-21 ENCOUNTER — Ambulatory Visit (INDEPENDENT_AMBULATORY_CARE_PROVIDER_SITE_OTHER): Payer: Medicare Other

## 2017-10-21 VITALS — BP 132/70 | HR 60 | Temp 98.5°F | Resp 12 | Ht 72.0 in | Wt 241.2 lb

## 2017-10-21 DIAGNOSIS — Z1211 Encounter for screening for malignant neoplasm of colon: Secondary | ICD-10-CM

## 2017-10-21 DIAGNOSIS — Z0000 Encounter for general adult medical examination without abnormal findings: Secondary | ICD-10-CM | POA: Diagnosis not present

## 2017-10-21 NOTE — Patient Instructions (Signed)
Richard Stafford , Thank you for taking time to come for your Medicare Wellness Visit. I appreciate your ongoing commitment to your health goals. Please review the following plan we discussed and let me know if I can assist you in the future.   Screening recommendations/referrals: Colorectal Screening: Completed colonoscopy 02/20/14. Repeat every 10 years. You will receive a call from our office regarding your appointment.  Vision/Dental Exams: Recommended yearly ophthalmology/optometry visit for glaucoma screening and checkup Recommended yearly dental visit for hygiene and checkup  Vaccinations: Influenza vaccine: Up to date Pneumococcal vaccine: Completed series Tdap vaccine: Up to date Shingles vaccine: Please call your insurance company to determine your out of pocket expense for the Shingrix vaccine. You may also receive this vaccine at your local pharmacy or Health Dept.   Advanced directives: Advance directive discussed with you today. I have provided a copy for you to complete at home and have notarized. Once this is complete please bring a copy in to our office so we can scan it into your chart.  Conditions/risks identified: Recommend to drink at least 6-8 8oz glasses of water per day.  Next appointment: Please schedule your Annual Wellness Visit with your Nurse Health Advisor in one year.  Preventive Care 45 Years and Older, Male Preventive care refers to lifestyle choices and visits with your health care provider that can promote health and wellness. What does preventive care include?  A yearly physical exam. This is also called an annual well check.  Dental exams once or twice a year.  Routine eye exams. Ask your health care provider how often you should have your eyes checked.  Personal lifestyle choices, including:  Daily care of your teeth and gums.  Regular physical activity.  Eating a healthy diet.  Avoiding tobacco and drug use.  Limiting alcohol use.  Practicing  safe sex.  Taking low doses of aspirin every day.  Taking vitamin and mineral supplements as recommended by your health care provider. What happens during an annual well check? The services and screenings done by your health care provider during your annual well check will depend on your age, overall health, lifestyle risk factors, and family history of disease. Counseling  Your health care provider may ask you questions about your:  Alcohol use.  Tobacco use.  Drug use.  Emotional well-being.  Home and relationship well-being.  Sexual activity.  Eating habits.  History of falls.  Memory and ability to understand (cognition).  Work and work Statistician. Screening  You may have the following tests or measurements:  Height, weight, and BMI.  Blood pressure.  Lipid and cholesterol levels. These may be checked every 5 years, or more frequently if you are over 32 years old.  Skin check.  Lung cancer screening. You may have this screening every year starting at age 97 if you have a 30-pack-year history of smoking and currently smoke or have quit within the past 15 years.  Fecal occult blood test (FOBT) of the stool. You may have this test every year starting at age 29.  Flexible sigmoidoscopy or colonoscopy. You may have a sigmoidoscopy every 5 years or a colonoscopy every 10 years starting at age 67.  Prostate cancer screening. Recommendations will vary depending on your family history and other risks.  Hepatitis C blood test.  Hepatitis B blood test.  Sexually transmitted disease (STD) testing.  Diabetes screening. This is done by checking your blood sugar (glucose) after you have not eaten for a while (fasting). You may  have this done every 1-3 years.  Abdominal aortic aneurysm (AAA) screening. You may need this if you are a current or former smoker.  Osteoporosis. You may be screened starting at age 13 if you are at high risk. Talk with your health care  provider about your test results, treatment options, and if necessary, the need for more tests. Vaccines  Your health care provider may recommend certain vaccines, such as:  Influenza vaccine. This is recommended every year.  Tetanus, diphtheria, and acellular pertussis (Tdap, Td) vaccine. You may need a Td booster every 10 years.  Zoster vaccine. You may need this after age 51.  Pneumococcal 13-valent conjugate (PCV13) vaccine. One dose is recommended after age 80.  Pneumococcal polysaccharide (PPSV23) vaccine. One dose is recommended after age 54. Talk to your health care provider about which screenings and vaccines you need and how often you need them. This information is not intended to replace advice given to you by your health care provider. Make sure you discuss any questions you have with your health care provider. Document Released: 08/23/2015 Document Revised: 04/15/2016 Document Reviewed: 05/28/2015 Elsevier Interactive Patient Education  2017 [place] Prevention in the Home Falls can cause injuries. They can happen to people of all ages. There are many things you can do to make your home safe and to help prevent falls. What can I do on the outside of my home?  Regularly fix the edges of walkways and driveways and fix any cracks.  Remove anything that might make you trip as you walk through a door, such as a raised step or threshold.  Trim any bushes or trees on the path to your home.  Use bright outdoor lighting.  Clear any walking paths of anything that might make someone trip, such as rocks or tools.  Regularly check to see if handrails are loose or broken. Make sure that both sides of any steps have handrails.  Any raised decks and porches should have guardrails on the edges.  Have any leaves, snow, or ice cleared regularly.  Use sand or salt on walking paths during winter.  Clean up any spills in your garage right away. This includes oil or grease  spills. What can I do in the bathroom?  Use night lights.  Install grab bars by the toilet and in the tub and shower. Do not use towel bars as grab bars.  Use non-skid mats or decals in the tub or shower.  If you need to sit down in the shower, use a plastic, non-slip stool.  Keep the floor dry. Clean up any water that spills on the floor as soon as it happens.  Remove soap buildup in the tub or shower regularly.  Attach bath mats securely with double-sided non-slip rug tape.  Do not have throw rugs and other things on the floor that can make you trip. What can I do in the bedroom?  Use night lights.  Make sure that you have a light by your bed that is easy to reach.  Do not use any sheets or blankets that are too big for your bed. They should not hang down onto the floor.  Have a firm chair that has side arms. You can use this for support while you get dressed.  Do not have throw rugs and other things on the floor that can make you trip. What can I do in the kitchen?  Clean up any spills right away.  Avoid walking on wet  floors.  Keep items that you use a lot in easy-to-reach places.  If you need to reach something above you, use a strong step stool that has a grab bar.  Keep electrical cords out of the way.  Do not use floor polish or wax that makes floors slippery. If you must use wax, use non-skid floor wax.  Do not have throw rugs and other things on the floor that can make you trip. What can I do with my stairs?  Do not leave any items on the stairs.  Make sure that there are handrails on both sides of the stairs and use them. Fix handrails that are broken or loose. Make sure that handrails are as long as the stairways.  Check any carpeting to make sure that it is firmly attached to the stairs. Fix any carpet that is loose or worn.  Avoid having throw rugs at the top or bottom of the stairs. If you do have throw rugs, attach them to the floor with carpet  tape.  Make sure that you have a light switch at the top of the stairs and the bottom of the stairs. If you do not have them, ask someone to add them for you. What else can I do to help prevent falls?  Wear shoes that:  Do not have high heels.  Have rubber bottoms.  Are comfortable and fit you well.  Are closed at the toe. Do not wear sandals.  If you use a stepladder:  Make sure that it is fully opened. Do not climb a closed stepladder.  Make sure that both sides of the stepladder are locked into place.  Ask someone to hold it for you, if possible.  Clearly mark and make sure that you can see:  Any grab bars or handrails.  First and last steps.  Where the edge of each step is.  Use tools that help you move around (mobility aids) if they are needed. These include:  Canes.  Walkers.  Scooters.  Crutches.  Turn on the lights when you go into a dark area. Replace any light bulbs as soon as they burn out.  Set up your furniture so you have a clear path. Avoid moving your furniture around.  If any of your floors are uneven, fix them.  If there are any pets around you, be aware of where they are.  Review your medicines with your doctor. Some medicines can make you feel dizzy. This can increase your chance of falling. Ask your doctor what other things that you can do to help prevent falls. This information is not intended to replace advice given to you by your health care provider. Make sure you discuss any questions you have with your health care provider. Document Released: 05/23/2009 Document Revised: 01/02/2016 Document Reviewed: 08/31/2014 Elsevier Interactive Patient Education  2017 [employer].

## 2017-10-21 NOTE — Progress Notes (Signed)
Subjective:   Richard Stafford is a 72 y.o. male who presents for Medicare Annual/Subsequent preventive examination.  Review of Systems:  N/A Cardiac Risk Factors include: advanced age (>27men, >100 women);dyslipidemia;hypertension;male gender;obesity (BMI >30kg/m2)     Objective:    Vitals: BP 132/70 (BP Location: Right Arm, Patient Position: Sitting, Cuff Size: Normal)   Pulse 60   Temp 98.5 F (36.9 C) (Oral)   Resp 12   Ht 6' (1.829 m)   Wt 241 lb 3.2 oz (109.4 kg)   SpO2 92%   BMI 32.71 kg/m   Body mass index is 32.71 kg/m.  Advanced Directives 10/21/2017 07/01/2015 03/29/2015  Does Patient Have a Medical Advance Directive? No No No  Would patient like information on creating a medical advance directive? Yes (MAU/Ambulatory/Procedural Areas - Information given) No - patient declined information -    Tobacco Social History   Tobacco Use  Smoking Status Former Smoker  . Packs/day: 1.00  . Years: 4.00  . Pack years: 4.00  . Types: Cigarettes  . Last attempt to quit: 1988  . Years since quitting: 31.2  Smokeless Tobacco Never Used  Tobacco Comment   smoking cessation materials not required     Counseling given: No Comment: smoking cessation materials not required   Clinical Intake:  Pre-visit preparation completed: Yes  Pain : No/denies pain   BMI - recorded: 32.71 Nutritional Status: BMI > 30  Obese Nutritional Risks: None Diabetes: No  How often do you need to have someone help you when you read instructions, pamphlets, or other written materials from your doctor or pharmacy?: 1 - Never  Interpreter Needed?: No  Information entered by :: AEversole, LPN  Past Medical History:  Diagnosis Date  . Hyperlipidemia    Past Surgical History:  Procedure Laterality Date  . COLONOSCOPY  2014   cleared for 5 years- [facility] docs  . HERNIA REPAIR    . TOOTH EXTRACTION    . varicose veins     Family History  Problem Relation Age of Onset  . Cancer Father   .  Heart disease Father   . Cancer Brother   . Cataracts Mother    Social History   Socioeconomic History  . Marital status: Married    Spouse name: None  . Number of children: 2  . Years of education: None  . Highest education level: Master's degree (e.g., MA, MS, MEng, MEd, MSW, MBA)  Social Needs  . Financial resource strain: Not hard at all  . Food insecurity - worry: Never true  . Food insecurity - inability: Never true  . Transportation needs - medical: No  . Transportation needs - non-medical: No  Occupational History  . Occupation: Retired  Tobacco Use  . Smoking status: Former Smoker    Packs/day: 1.00    Years: 4.00    Pack years: 4.00    Types: Cigarettes    Last attempt to quit: 1988    Years since quitting: 31.2  . Smokeless tobacco: Never Used  . Tobacco comment: smoking cessation materials not required  Substance and Sexual Activity  . Alcohol use: Yes    Alcohol/week: 7.8 oz    Types: 6 Cans of beer, 7 Glasses of wine per week  . Drug use: No  . Sexual activity: Yes  Other Topics Concern  . None  Social History Narrative  . None    Outpatient Encounter Medications as of 10/21/2017  Medication Sig  . Ascorbic Acid (VITAMIN C) 500  MG CAPS Take 1 capsule by mouth daily.  Marland Kitchen aspirin 81 MG tablet Take 1 tablet by mouth daily.  Marland Kitchen doxycycline (VIBRA-TABS) 100 MG tablet Take 1 tablet (100 mg total) by mouth 2 (two) times daily.  . finasteride (PROSCAR) 5 MG tablet Take 5 mg by mouth daily. Urology/ [facility]  . fluticasone (FLONASE) 50 MCG/ACT nasal spray Place 1 spray into both nostrils daily.  . Glucosamine 500 MG CAPS Take 2 capsules by mouth once.   . Multiple Vitamins-Minerals (MENS MULTIVITAMIN PLUS PO) Take 1 tablet by mouth daily.  . Omega-3 Fatty Acids (FISH OIL) 1000 MG CAPS Take 1 capsule (1,000 mg total) by mouth daily.  . sildenafil (REVATIO) 20 MG tablet Urology/ [facility]   No facility-administered encounter medications on file as of 10/21/2017.      Activities of Daily Living In your present state of health, do you have any difficulty performing the following activities: 10/21/2017  Hearing? N  Comment denies hearing aids; tinnitus  Vision? N  Comment wears eyeglasses  Difficulty concentrating or making decisions? N  Walking or climbing stairs? Y  Comment R knee pain  Dressing or bathing? N  Doing errands, shopping? N  Preparing Food and eating ? N  Comment denies dentures  Using the Toilet? N  In the past six months, have you accidently leaked urine? N  Do you have problems with loss of bowel control? N  Managing your Medications? N  Managing your Finances? N  Housekeeping or managing your Housekeeping? N  Some recent data might be hidden    Patient Care Team: Juline Patch, MD as PCP - General (Family Medicine)   Assessment:   This is a routine wellness examination for Richard Stafford.  Exercise Activities and Dietary recommendations Current Exercise Habits: Structured exercise class, Type of exercise: strength training/weights, Time (Minutes): > 60, Frequency (Times/Week): 3, Weekly Exercise (Minutes/Week): 0, Intensity: Mild  Goals    . DIET - INCREASE WATER INTAKE     Recommend to drink at least 6-8 8oz glasses of water per day.       Fall Risk Fall Risk  10/21/2017 04/02/2017 07/01/2015 03/29/2015  Falls in the past year? No No Yes No  Number falls in past yr: - - 1 -  Injury with Fall? - - Yes -  Comment - - here today for eval from fall -   Is the patient's home free of loose throw rugs in walkways, pet beds, electrical cords, etc?   Yes Does the patient have any grab bars in the bathroom? No  Does the patient use a shower chair when bathing? Yes Does the patient have any stairs in or around the home? Yes If so, are there any handrails?  Yes Does the patient have adequate lighting?  Yes Does the patient use a cane, walker or w/c? No Does the patient use of an elevated toilet seat? No  Timed Get Up and Go  Performed: Yes. Pt ambulated 10 feet within 7 sec. Gait stead-fast and without the use of an assistive device. No intervention required at this time. Fall risk prevention has been discussed.  Pt declined my offer to send Community Resource Referral to Care Guide for  installation of grab bars in the shower, shower chair or an elevated toilet seat.  Depression Screen PHQ 2/9 Scores 10/21/2017 10/20/2017 04/02/2017 07/01/2015  PHQ - 2 Score 0 0 0 0  PHQ- 9 Score 0 1 0 -    Cognitive Function  6CIT Screen 10/21/2017  What Year? 0 points  What month? 0 points  What time? 0 points  Count back from 20 0 points  Months in reverse 0 points  Repeat phrase 6 points  Total Score 6    Immunization History  Administered Date(s) Administered  . Influenza Split 06/06/2011  . Influenza-Unspecified 05/10/2017  . Pneumococcal Conjugate-13 04/02/2017  . Pneumococcal Polysaccharide-23 03/27/2014  . Tdap 04/02/2017    Qualifies for Shingles Vaccine? Yes. Due for Zostavax or Shingrix vaccine. Education has been provided regarding the importance of this vaccine. Pt has been advised to call his insurance company to determine his out of pocket expense. Advised he may also receive this vaccine at his local pharmacy or Health Dept. Verbalized acceptance and understanding.  Screening Tests Health Maintenance  Topic Date Due  . COLONOSCOPY  02/14/2024  . TETANUS/TDAP  04/03/2027  . INFLUENZA VACCINE  Completed  . Hepatitis C Screening  Completed  . PNA vac Low Risk Adult  Completed   Cancer Screenings: Lung: Low Dose CT Chest recommended if Age 18-80 years, 30 pack-year currently smoking OR have quit w/in 15years. Patient does not qualify. Colorectal: Completed colonoscopy 02/20/14. Repeat every 10 years. Pt expressed concern about screening every 10 years. Feels screenings should be completed more frequently. Requested to be referred to GI for repeat screening colonoscopy. Ordered referral to GI for  repeat screening colonoscopy. Message sent to referral coordinator for scheduling purposes. Pt aware he will receive a call from our office re: his appt.  Additional Screenings: Hepatitis B/HIV/Syphillis: Does not qualify Hepatitis C Screening: Completed 04/02/17    Plan:  I have personally reviewed and addressed the Medicare Annual Wellness questionnaire and have noted the following in the patient's chart:  A. Medical and social history B. Use of alcohol, tobacco or illicit drugs  C. Current medications and supplements D. Functional ability and status E.  Nutritional status F.  Physical activity G. Advance directives H. List of other physicians I.  Hospitalizations, surgeries, and ER visits in previous 12 months J.  [place] such as hearing and vision if needed, cognitive and depression L. Referrals and appointments - none  In addition, I have reviewed and discussed with patient certain preventive protocols, quality metrics, and best practice recommendations. A written personalized care plan for preventive services as well as general preventive health recommendations were provided to patient.  Signed,  Aleatha Borer, LPN Nurse Health Advisor  MD Recommendations: Due for Zostavax or Shingrix vaccine. Education has been provided regarding the importance of this vaccine. Pt has been advised to call his insurance company to determine his out of pocket expense. Advised he may also receive this vaccine at his local pharmacy or Health Dept. Verbalized acceptance and understanding.  Colorectal screening: Completed colonoscopy 02/20/14. Repeat every 10 years. Pt expressed concern about screening every 10 years. Feels screenings should be completed more frequently. Requested to be referred to GI for repeat screening colonoscopy. Ordered referral to GI for repeat screening colonoscopy. Message sent to referral coordinator for scheduling purposes. Pt aware he will receive a call from our  office re: his appt.

## 2018-01-07 HISTORY — PX: NASAL ENDOSCOPY: SHX286

## 2018-04-04 ENCOUNTER — Ambulatory Visit (INDEPENDENT_AMBULATORY_CARE_PROVIDER_SITE_OTHER): Payer: Medicare Other | Admitting: Family Medicine

## 2018-04-04 ENCOUNTER — Encounter: Payer: Self-pay | Admitting: Family Medicine

## 2018-04-04 VITALS — BP 128/78 | HR 60 | Ht 72.0 in | Wt 242.0 lb

## 2018-04-04 DIAGNOSIS — Z0000 Encounter for general adult medical examination without abnormal findings: Secondary | ICD-10-CM

## 2018-04-04 DIAGNOSIS — E785 Hyperlipidemia, unspecified: Secondary | ICD-10-CM

## 2018-04-04 DIAGNOSIS — Z1211 Encounter for screening for malignant neoplasm of colon: Secondary | ICD-10-CM

## 2018-04-04 DIAGNOSIS — R030 Elevated blood-pressure reading, without diagnosis of hypertension: Secondary | ICD-10-CM

## 2018-04-04 LAB — HEMOCCULT GUIAC POC 1CARD (OFFICE): FECAL OCCULT BLD: NEGATIVE

## 2018-04-04 NOTE — Assessment & Plan Note (Addendum)
Chronic Controlled. Continue sodium limitations and weight surveillance. Recheck renal panel.

## 2018-04-04 NOTE — Assessment & Plan Note (Addendum)
Chronic Controlled Continue Omega 3 and weight maintenance. Check lipid panel.

## 2018-04-04 NOTE — Progress Notes (Signed)
Name: Richard Stafford   MRN: [id number]    DOB: 02-02-1946   Date:04/04/2018       Progress Note  Subjective  Chief Complaint  Chief Complaint  Patient presents with  . Annual Exam    needs lipid and renal    Patient is a 72 year old male who presents for a comprehensive physical exam. The patient reports the following problems: right knee pain. Health maintenance has been reviewed and patient desires influenza in October.  Hypertension  This is a chronic problem. The current episode started more than 1 year ago. The problem has been gradually improving since onset. The problem is controlled. Pertinent negatives include no anxiety, blurred vision, chest pain, headaches, malaise/fatigue, neck pain, orthopnea, palpitations, peripheral edema, PND, shortness of breath or sweats. There are no associated agents to hypertension. Risk factors for coronary artery disease include dyslipidemia. Past treatments include lifestyle changes. The current treatment provides moderate improvement. There are no compliance problems.  There is no history of angina, kidney disease, CAD/MI, CVA, heart failure, left ventricular hypertrophy, PVD or retinopathy. There is no history of chronic renal disease, a hypertension causing med or renovascular disease.  Hyperlipidemia  This is a chronic problem. The problem is controlled. Recent lipid tests were reviewed and are normal. He has no history of chronic renal disease, diabetes, hypothyroidism, liver disease, obesity or nephrotic syndrome. Pertinent negatives include no chest pain, focal weakness, leg pain, myalgias or shortness of breath. The current treatment provides moderate improvement of lipids. There are no compliance problems.  Risk factors for coronary artery disease include dyslipidemia.    Elevated blood pressure, situational Chronic Controlled. Continue sodium limitations and weight surveillance. Recheck renal panel.  HLD (hyperlipidemia) Chronic Controlled  Continue Omega 3 and weight maintenance. Check lipid panel.   Past Medical History:  Diagnosis Date  . Hyperlipidemia     Past Surgical History:  Procedure Laterality Date  . COLONOSCOPY  2014   cleared for 5 years- [facility] docs  . CYST EXCISION     cyst in maxillary sinus removed  . HERNIA REPAIR    . TOOTH EXTRACTION    . varicose veins      Family History  Problem Relation Age of Onset  . Cancer Father   . Heart disease Father   . Cancer Brother   . Cataracts Mother     Social History   Socioeconomic History  . Marital status: Married    Spouse name: Not on file  . Number of children: 2  . Years of education: Not on file  . Highest education level: Master's degree (e.g., MA, MS, MEng, MEd, MSW, MBA)  Occupational History  . Occupation: Retired  Scientific laboratory technician  . Financial resource strain: Not hard at all  . Food insecurity:    Worry: Never true    Inability: Never true  . Transportation needs:    Medical: No    Non-medical: No  Tobacco Use  . Smoking status: Former Smoker    Packs/day: 1.00    Years: 4.00    Pack years: 4.00    Types: Cigarettes    Last attempt to quit: 1988    Years since quitting: 31.6  . Smokeless tobacco: Never Used  . Tobacco comment: smoking cessation materials not required  Substance and Sexual Activity  . Alcohol use: Yes    Alcohol/week: 13.0 standard drinks    Types: 7 Glasses of wine, 6 Cans of beer per week  . Drug use: No  .  Sexual activity: Yes  Lifestyle  . Physical activity:    Days per week: 3 days    Minutes per session: 100 min  . Stress: Not at all  Relationships  . Social connections:    Talks on phone: Patient refused    Gets together: Patient refused    Attends religious service: Patient refused    Active member of club or organization: Patient refused    Attends meetings of clubs or organizations: Patient refused    Relationship status: Married  . Intimate partner violence:    Fear of current or ex  partner: No    Emotionally abused: No    Physically abused: No    Forced sexual activity: No  Other Topics Concern  . Not on file  Social History Narrative  . Not on file    No Known Allergies  Outpatient Medications Prior to Visit  Medication Sig Dispense Refill  . Ascorbic Acid (VITAMIN C) 500 MG CAPS Take 1 capsule by mouth daily.    Marland Kitchen aspirin 81 MG tablet Take 1 tablet by mouth daily.    . finasteride (PROSCAR) 5 MG tablet Take 5 mg by mouth daily. Urology/ [facility]    . fluticasone (FLONASE) 50 MCG/ACT nasal spray Place 1 spray into both nostrils daily. (Patient taking differently: Place 1 spray into both nostrils as needed. ) 1 g 11  . Glucosamine 500 MG CAPS Take 2 capsules by mouth once.     . Multiple Vitamins-Minerals (MENS MULTIVITAMIN PLUS PO) Take 1 tablet by mouth daily.    . Omega-3 Fatty Acids (FISH OIL) 1000 MG CAPS Take 1 capsule (1,000 mg total) by mouth daily. 30 capsule 11  . sildenafil (REVATIO) 20 MG tablet Urology/ [facility]    . doxycycline (VIBRA-TABS) 100 MG tablet Take 1 tablet (100 mg total) by mouth 2 (two) times daily. 20 tablet 0   No facility-administered medications prior to visit.     Review of Systems  Constitutional: Negative for chills, fever, malaise/fatigue and weight loss.  HENT: Negative for ear discharge, ear pain and sore throat.   Eyes: Negative for blurred vision.  Respiratory: Negative for cough, sputum production, shortness of breath and wheezing.   Cardiovascular: Negative for chest pain, palpitations, orthopnea, leg swelling and PND.  Gastrointestinal: Negative for abdominal pain, blood in stool, constipation, diarrhea, heartburn, melena and nausea.  Genitourinary: Negative for dysuria, frequency, hematuria and urgency.  Musculoskeletal: Positive for joint pain. Negative for back pain, myalgias and neck pain.  Skin: Negative for rash.  Neurological: Negative for dizziness, tingling, sensory change, focal weakness and headaches.   Endo/Heme/Allergies: Negative for environmental allergies and polydipsia. Does not bruise/bleed easily.  Psychiatric/Behavioral: Negative for depression and suicidal ideas. The patient is not nervous/anxious and does not have insomnia.      Objective  Vitals:   04/04/18 0823  BP: 128/78  Pulse: 60  Weight: 242 lb (109.8 kg)  Height: 6' (1.829 m)    Physical Exam  Constitutional: He is oriented to person, place, and time. Vital signs are normal. He appears well-developed and well-nourished.  HENT:  Head: Normocephalic and atraumatic.  Right Ear: Hearing, tympanic membrane, external ear and ear canal normal.  Left Ear: Hearing, tympanic membrane, external ear and ear canal normal.  Nose: Nose normal. No mucosal edema.  Mouth/Throat: Uvula is midline and oropharynx is clear and moist. Normal dentition. No oropharyngeal exudate, posterior oropharyngeal edema or posterior oropharyngeal erythema.  Eyes: Pupils are equal, round, and reactive to light.  Conjunctivae, EOM and lids are normal. Right eye exhibits no discharge. Left eye exhibits no discharge. No scleral icterus.  Neck: Trachea normal, normal range of motion, full passive range of motion without pain and phonation normal. Neck supple. Normal carotid pulses, no hepatojugular reflux and no JVD present. No tracheal tenderness present. Carotid bruit is not present. No tracheal deviation present. No thyroid mass and no thyromegaly present.  Cardiovascular: Normal rate, regular rhythm, S1 normal, S2 normal, normal heart sounds, intact distal pulses and normal pulses. PMI is not displaced. Exam reveals no gallop, no S3, no S4, no distant heart sounds and no friction rub.  No murmur heard.  No systolic murmur is present.  No diastolic murmur is present. Pulses:      Carotid pulses are 2+ on the right side, and 2+ on the left side.      Radial pulses are 2+ on the right side, and 2+ on the left side.       Femoral pulses are 2+ on the  right side, and 2+ on the left side.      Popliteal pulses are 2+ on the right side, and 2+ on the left side.       Dorsalis pedis pulses are 2+ on the right side, and 2+ on the left side.       Posterior tibial pulses are 2+ on the right side, and 2+ on the left side.  Pulmonary/Chest: Breath sounds normal. No respiratory distress. He has no wheezes. He has no rales. Right breast exhibits no mass. Left breast exhibits no mass.  Abdominal: Soft. Normal aorta and bowel sounds are normal. He exhibits no mass. There is no hepatosplenomegaly. There is no tenderness. There is no rebound, no guarding and no CVA tenderness. Hernia confirmed negative in the ventral area, confirmed negative in the right inguinal area and confirmed negative in the left inguinal area.  Genitourinary: Rectum normal, prostate normal, testes normal and penis normal. Rectal exam shows guaiac negative stool. Prostate is not enlarged and not tender. Right testis shows no mass. Left testis shows no mass. Uncircumcised.  Musculoskeletal: Normal range of motion. He exhibits no edema or tenderness.       Right knee: He exhibits normal range of motion, no swelling and no effusion. No tenderness found. No medial joint line, no lateral joint line, no MCL, no LCL and no patellar tendon tenderness noted.  Lymphadenopathy:       Head (right side): No submandibular adenopathy present.       Head (left side): No submandibular adenopathy present.    He has no cervical adenopathy.       Right cervical: No superficial cervical adenopathy present.      Left cervical: No superficial cervical adenopathy present.    He has no axillary adenopathy. No inguinal adenopathy noted on the right or left side.  Neurological: He is alert and oriented to person, place, and time. He has normal strength and normal reflexes. No cranial nerve deficit or sensory deficit.  Reflex Scores:      Tricep reflexes are 2+ on the right side and 2+ on the left side.       Bicep reflexes are 2+ on the right side and 2+ on the left side.      Brachioradialis reflexes are 2+ on the right side and 2+ on the left side.      Patellar reflexes are 2+ on the right side and 2+ on the left side.  Achilles reflexes are 2+ on the right side and 2+ on the left side. Skin: Skin is warm, dry and intact. Capillary refill takes less than 2 seconds. No rash noted. No pallor.  Nursing note and vitals reviewed.     Assessment & Plan  Problem List Items Addressed This Visit      Cardiovascular and Mediastinum   Elevated blood pressure, situational    Chronic Controlled. Continue sodium limitations and weight surveillance. Recheck renal panel.      Relevant Orders   Renal Function Panel     Other   HLD (hyperlipidemia)    Chronic Controlled Continue Omega 3 and weight maintenance. Check lipid panel.      Relevant Orders   Lipid panel    Other Visit Diagnoses    Annual physical exam    -  Primary   No subjectuve/objective concerns noted. Maintence  and systemsreviewed.   Relevant Orders   POCT Occult Blood Stool (Completed)   Colon cancer screening       negative occult   Relevant Orders   POCT Occult Blood Stool (Completed)      No orders of the defined types were placed in this encounter. HIPOLITO MARTINEZLOPEZ is a 72 y.o. male who presents today for his Complete Annual Exam. He feels well. He reports exercising . He reports he is sleeping well. 22 Immunizations are reviewed and recommendations provided.   Age appropriate screening tests are discussed. Counseling given for risk factor reduction interventions. Health risks of being over weight were discussed and patient was counseled on weight loss options and exercise.  Dr. [facility]  04/04/18

## 2018-04-05 LAB — RENAL FUNCTION PANEL
Albumin: 4.6 g/dL (ref 3.5–4.8)
BUN / CREAT RATIO: 14 (ref 10–24)
BUN: 15 mg/dL (ref 8–27)
CALCIUM: 8.9 mg/dL (ref 8.6–10.2)
CO2: 24 mmol/L (ref 20–29)
CREATININE: 1.08 mg/dL (ref 0.76–1.27)
Chloride: 101 mmol/L (ref 96–106)
GFR calc Af Amer: 80 mL/min/{1.73_m2} (ref >59)
GFR calc non Af Amer: 69 mL/min/{1.73_m2} (ref >59)
Glucose: 88 mg/dL (ref 65–99)
Phosphorus: 3 mg/dL (ref 2.5–4.5)
Potassium: 4.8 mmol/L (ref 3.5–5.2)
SODIUM: 142 mmol/L (ref 134–144)

## 2018-04-05 LAB — LIPID PANEL
CHOL/HDL RATIO: 3.8 ratio (ref 0.0–5.0)
CHOLESTEROL TOTAL: 192 mg/dL (ref 100–199)
HDL: 50 mg/dL (ref >39)
LDL CALC: 120 mg/dL — AB (ref 0–99)
Triglycerides: 108 mg/dL (ref 0–149)
VLDL Cholesterol Cal: 22 mg/dL (ref 5–40)

## 2018-10-07 ENCOUNTER — Telehealth: Payer: Self-pay | Admitting: Family Medicine

## 2018-10-07 NOTE — Telephone Encounter (Signed)
Called to schedule Medicare Annual Wellness Visit with the Nurse Health Advisor. No answer, left message on machine.  If patient returns call, please note: their last AWV was on 10/21/2017, please schedule AWV with [facility] any date AFTER 10/22/2018.  Thank you! For any questions please contact: Janace Hoard at [phone] or Skype [email or web address]

## 2019-02-08 LAB — PSA: PSA: 1.67

## 2019-04-07 ENCOUNTER — Other Ambulatory Visit
Admission: RE | Admit: 2019-04-07 | Discharge: 2019-04-07 | Disposition: A | Discharge status: Home / Self Care | Payer: Medicare Other | Source: Ambulatory Visit | Attending: Gastroenterology | Admitting: Gastroenterology

## 2019-04-07 ENCOUNTER — Ambulatory Visit (INDEPENDENT_AMBULATORY_CARE_PROVIDER_SITE_OTHER): Payer: Medicare Other | Admitting: Family Medicine

## 2019-04-07 ENCOUNTER — Other Ambulatory Visit: Payer: Self-pay

## 2019-04-07 ENCOUNTER — Encounter: Payer: Self-pay | Admitting: Family Medicine

## 2019-04-07 VITALS — BP 120/66 | HR 52 | Ht 72.0 in | Wt 244.0 lb

## 2019-04-07 DIAGNOSIS — R030 Elevated blood-pressure reading, without diagnosis of hypertension: Secondary | ICD-10-CM | POA: Diagnosis not present

## 2019-04-07 DIAGNOSIS — Z23 Encounter for immunization: Secondary | ICD-10-CM | POA: Diagnosis not present

## 2019-04-07 DIAGNOSIS — R7402 Elevation of levels of lactic acid dehydrogenase (LDH): Secondary | ICD-10-CM

## 2019-04-07 DIAGNOSIS — E785 Hyperlipidemia, unspecified: Secondary | ICD-10-CM

## 2019-04-07 DIAGNOSIS — Z0000 Encounter for general adult medical examination without abnormal findings: Secondary | ICD-10-CM

## 2019-04-07 DIAGNOSIS — Z1211 Encounter for screening for malignant neoplasm of colon: Secondary | ICD-10-CM | POA: Diagnosis not present

## 2019-04-07 DIAGNOSIS — Z20828 Contact with and (suspected) exposure to other viral communicable diseases: Secondary | ICD-10-CM | POA: Diagnosis not present

## 2019-04-07 DIAGNOSIS — Z01812 Encounter for preprocedural laboratory examination: Secondary | ICD-10-CM | POA: Insufficient documentation

## 2019-04-07 DIAGNOSIS — R351 Nocturia: Secondary | ICD-10-CM

## 2019-04-07 DIAGNOSIS — R740 Nonspecific elevation of levels of transaminase and lactic acid dehydrogenase [LDH]: Secondary | ICD-10-CM

## 2019-04-07 LAB — HEMOCCULT GUIAC POC 1CARD (OFFICE): Fecal Occult Blood, POC: NEGATIVE

## 2019-04-07 LAB — SARS CORONAVIRUS 2 (TAT 6-24 HRS): SARS Coronavirus 2: NEGATIVE

## 2019-04-07 NOTE — Progress Notes (Signed)
Date:  04/07/2019   Name:  Richard Stafford   DOB:  1945-12-08   MRN:  [id number]   Chief Complaint: Annual Exam and influenza vacc need  Patient is a 73 year old [place] who presents for a comprehensive physical exam. The patient reports the following problems: none. Health maintenance has been reviewed up to date.   Review of Systems  Constitutional: Negative for chills and fever.  HENT: Negative for drooling, ear discharge, ear pain and sore throat.   Respiratory: Negative for cough, shortness of breath and wheezing.   Cardiovascular: Negative for chest pain, palpitations and leg swelling.  Gastrointestinal: Negative for abdominal pain, blood in stool, constipation, diarrhea and nausea.  Endocrine: Negative for polydipsia.  Genitourinary: Negative for dysuria, frequency, hematuria and urgency.  Musculoskeletal: Negative for back pain, myalgias and neck pain.  Skin: Negative for rash.  Allergic/Immunologic: Negative for environmental allergies.  Neurological: Negative for dizziness and headaches.  Hematological: Does not bruise/bleed easily.  Psychiatric/Behavioral: Negative for suicidal ideas. The patient is not nervous/anxious.     Patient Active Problem List   Diagnosis Date Noted  . Need for hepatitis C screening test 04/02/2017  . Elevated blood pressure, situational 12/21/2014  . Routine general medical examination at a health care facility 12/21/2014  . Elevation of level of transaminase and lactic acid dehydrogenase (LDH) 12/21/2014  . 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 HLD (hyperlipidemia) 12/21/2014    No Known Allergies  Past Surgical History:  Procedure Laterality Date  . COLONOSCOPY  2014   cleared for 5 years- [facility] docs  . CYST EXCISION     cyst in maxillary sinus removed  . HERNIA REPAIR    . TOOTH EXTRACTION    . varicose veins      Social History   Tobacco Use  . Smoking status: Former Smoker    Packs/day: 1.00    Years: 4.00     Pack years: 4.00    Types: Cigarettes    Quit date: 1988    Years since quitting: 32.6  . Smokeless tobacco: Never Used  . Tobacco comment: smoking cessation materials not required  Substance Use Topics  . Alcohol use: Yes    Alcohol/week: 13.0 standard drinks    Types: 7 Glasses of wine, 6 Cans of beer per week  . Drug use: No     Medication list has been reviewed and updated.  Current Meds  Medication Sig  . Ascorbic Acid (VITAMIN C) 500 MG CAPS Take 1 capsule by mouth daily.  Marland Kitchen aspirin 81 MG tablet Take 1 tablet by mouth daily.  . finasteride (PROSCAR) 5 MG tablet Take 5 mg by mouth daily. Urology/ [facility]  . Glucosamine 500 MG CAPS Take 2 capsules by mouth once.   . Multiple Vitamins-Minerals (MENS MULTIVITAMIN PLUS PO) Take 1 tablet by mouth daily.  . Omega-3 Fatty Acids (FISH OIL) 1000 MG CAPS Take 1 capsule (1,000 mg total) by mouth daily.  . sildenafil (REVATIO) 20 MG tablet Take 20 mg by mouth as needed. Urology/ [facility]  . zinc gluconate 50 MG tablet Take 50 mg by mouth daily.    PHQ 2/9 Scores 04/07/2019 04/04/2018 10/21/2017 10/20/2017  PHQ - 2 Score 0 0 0 0  PHQ- 9 Score 0 1 0 1    BP Readings from Last 3 Encounters:  04/07/19 120/66  04/04/18 128/78  10/21/17 132/70    Physical Exam Vitals signs and nursing note reviewed.  Constitutional:  Appearance: Normal appearance. He is overweight.  HENT:     Head: Normocephalic.     Jaw: There is normal jaw occlusion.     Right Ear: Hearing, tympanic membrane, ear canal and external ear normal.     Left Ear: Hearing, tympanic membrane, ear canal and external ear normal.     Nose: Nose normal.     Mouth/Throat:     Lips: Pink.     Mouth: Mucous membranes are moist.     Dentition: Normal dentition.     Tongue: No lesions.     Palate: No mass.     Pharynx: Oropharynx is clear. Uvula midline.  Eyes:     General: Lids are normal. Vision grossly intact. Gaze aligned appropriately. No scleral icterus.       Right  eye: No discharge.        Left eye: No discharge.     Extraocular Movements: Extraocular movements intact.     Conjunctiva/sclera: Conjunctivae normal.     Pupils: Pupils are equal, round, and reactive to light. Pupils are equal.     Funduscopic exam:    Right eye: No AV nicking.        Left eye: No AV nicking.  Neck:     Musculoskeletal: Full passive range of motion without pain, normal range of motion and neck supple.     Thyroid: No thyroid mass, thyromegaly or thyroid tenderness.     Vascular: Normal carotid pulses. No carotid bruit, hepatojugular reflux or JVD.     Trachea: Trachea and phonation normal. No tracheal deviation.  Cardiovascular:     Rate and Rhythm: Normal rate and regular rhythm.     Chest Wall: PMI is not displaced. No thrill.     Pulses: Normal pulses.     Heart sounds: Normal heart sounds, S1 normal and S2 normal. No murmur. No systolic murmur. No diastolic murmur. No friction rub. No gallop. No S3 or S4 sounds.   Pulmonary:     Effort: Pulmonary effort is normal. No respiratory distress.     Breath sounds: Normal breath sounds. No decreased air movement. No decreased breath sounds, wheezing, rhonchi or rales.  Chest:     Breasts: Breasts are symmetrical.        Right: Normal.        Left: Normal.  Abdominal:     General: Bowel sounds are normal.     Palpations: Abdomen is soft. There is no hepatomegaly, splenomegaly or mass.     Tenderness: There is no abdominal tenderness. There is no guarding or rebound.     Hernia: There is no hernia in the left inguinal area or right inguinal area.  Genitourinary:    Penis: Normal and circumcised.      Scrotum/Testes: Normal.        Right: Mass not present.        Left: Mass not present.     Epididymis:     Right: Normal.     Left: Normal.     Prostate: Normal. Not enlarged, not tender and no nodules present.     Rectum: Normal. Guaiac result negative. No mass.  Musculoskeletal: Normal range of motion.         General: No tenderness.     Lumbar back: Normal.  Lymphadenopathy:     Head:     Right side of head: No submental or submandibular adenopathy.     Left side of head: No submental or submandibular adenopathy.  Cervical: No cervical adenopathy.     Right cervical: No superficial, deep or posterior cervical adenopathy.    Left cervical: No superficial, deep or posterior cervical adenopathy.     Upper Body:     Right upper body: No supraclavicular, axillary or pectoral adenopathy.     Left upper body: No supraclavicular, axillary or pectoral adenopathy.     Lower Body: No right inguinal adenopathy. No left inguinal adenopathy.  Skin:    General: Skin is warm.     Capillary Refill: Capillary refill takes less than 2 seconds.     Coloration: Skin is not pale.     Findings: No rash.  Neurological:     Mental Status: He is alert and oriented to person, place, and time.     Cranial Nerves: Cranial nerves are intact. No cranial nerve deficit.     Sensory: Sensation is intact.     Motor: Motor function is intact.     Deep Tendon Reflexes: Reflexes are normal and symmetric.     Reflex Scores:      Tricep reflexes are 2+ on the right side and 2+ on the left side.      Bicep reflexes are 2+ on the right side and 2+ on the left side.      Brachioradialis reflexes are 2+ on the right side and 2+ on the left side.      Patellar reflexes are 2+ on the right side and 2+ on the left side.      Achilles reflexes are 2+ on the right side and 2+ on the left side. Psychiatric:        Attention and Perception: Attention and perception normal.        Mood and Affect: Mood and affect normal.        Speech: Speech normal.        Behavior: Behavior normal. Behavior is cooperative.        Cognition and Memory: Cognition and memory normal.     Wt Readings from Last 3 Encounters:  04/07/19 244 lb (110.7 kg)  04/04/18 242 lb (109.8 kg)  10/21/17 241 lb 3.2 oz (109.4 kg)    BP 120/66   Pulse (!) 52    Ht 6' (1.829 m)   Wt 244 lb (110.7 kg)   BMI 33.09 kg/m   Assessment and Plan: 1. Annual physical exam No subjective objective concerns noted on the history nor physical.  Patient's previous encounters and labs and imaging reviewed.  We will obtain a lipid panel comprehensive metabolic panel CBC.Richard Stafford is a 73 y.o. male who presents today for his Complete Annual Exam. He feels well. He reports exercising . He reports he is sleeping well. Immunizations are reviewed and recommendations provided.   Age appropriate screening tests are discussed. Counseling given for risk factor reduction interventions. - Lipid Panel With LDL/HDL Ratio - Comprehensive metabolic panel - CBC with Differential/Platelet - POCT Occult Blood Stool  2. Elevated blood pressure, situational Patient has normal blood pressure today and this is only been noted in situational circumstances.  Patient will continue to monitor sodium intake and exercise frequently. - Comprehensive metabolic panel  3. Hyperlipidemia, unspecified hyperlipidemia type Patient has elevated lipids in the past and is currently on omega-3 with no concerns. - Lipid Panel With LDL/HDL Ratio  4. Screening for colon cancer Guaiac guaiac negative on exam.  5. Elevation of level of transaminase and lactic acid dehydrogenase (LDH) Review of labs presumably from urology has  noted an elevation of LDH in the past as well as transaminases and we will obtain comprehensive metabolic panel and an LDH. - Lactate Dehydrogenase (LDH)  6. Nocturia Occasional nocturia but controlled with continuance of finasteride  7. Influenza vaccine needed Discussed and administered - Flu Vaccine QUAD High Dose(Fluad)

## 2019-04-08 LAB — COMPREHENSIVE METABOLIC PANEL
ALT: 31 IU/L (ref 0–44)
AST: 40 IU/L (ref 0–40)
Albumin/Globulin Ratio: 2.1 (ref 1.2–2.2)
Albumin: 4.8 g/dL — ABNORMAL HIGH (ref 3.7–4.7)
Alkaline Phosphatase: 71 IU/L (ref 39–117)
BUN/Creatinine Ratio: 12 (ref 10–24)
BUN: 13 mg/dL (ref 8–27)
Bilirubin Total: 0.6 mg/dL (ref 0.0–1.2)
CO2: 24 mmol/L (ref 20–29)
Calcium: 9 mg/dL (ref 8.6–10.2)
Chloride: 102 mmol/L (ref 96–106)
Creatinine, Ser: 1.12 mg/dL (ref 0.76–1.27)
GFR calc Af Amer: 76 mL/min/{1.73_m2} (ref >59)
GFR calc non Af Amer: 66 mL/min/{1.73_m2} (ref >59)
Globulin, Total: 2.3 g/dL (ref 1.5–4.5)
Glucose: 99 mg/dL (ref 65–99)
Potassium: 4.6 mmol/L (ref 3.5–5.2)
Sodium: 141 mmol/L (ref 134–144)
Total Protein: 7.1 g/dL (ref 6.0–8.5)

## 2019-04-08 LAB — CBC WITH DIFFERENTIAL/PLATELET
Basophils Absolute: 0.1 10*3/uL (ref 0.0–0.2)
Basos: 1 %
EOS (ABSOLUTE): 0.3 10*3/uL (ref 0.0–0.4)
Eos: 5 %
Hematocrit: 38.2 % (ref 37.5–51.0)
Hemoglobin: 13 g/dL (ref 13.0–17.7)
Immature Grans (Abs): 0 10*3/uL (ref 0.0–0.1)
Immature Granulocytes: 0 %
Lymphocytes Absolute: 1.6 10*3/uL (ref 0.7–3.1)
Lymphs: 32 %
MCH: 28.7 pg (ref 26.6–33.0)
MCHC: 34 g/dL (ref 31.5–35.7)
MCV: 84 fL (ref 79–97)
Monocytes Absolute: 0.8 10*3/uL (ref 0.1–0.9)
Monocytes: 16 %
Neutrophils Absolute: 2.3 10*3/uL (ref 1.4–7.0)
Neutrophils: 46 %
Platelets: 295 10*3/uL (ref 150–450)
RBC: 4.53 x10E6/uL (ref 4.14–5.80)
RDW: 14.1 % (ref 11.6–15.4)
WBC: 5 10*3/uL (ref 3.4–10.8)

## 2019-04-08 LAB — LIPID PANEL WITH LDL/HDL RATIO
Cholesterol, Total: 179 mg/dL (ref 100–199)
HDL: 48 mg/dL (ref >39)
LDL Calculated: 97 mg/dL (ref 0–99)
LDl/HDL Ratio: 2 ratio (ref 0.0–3.6)
Triglycerides: 171 mg/dL — ABNORMAL HIGH (ref 0–149)
VLDL Cholesterol Cal: 34 mg/dL (ref 5–40)

## 2019-04-08 LAB — LACTATE DEHYDROGENASE: LDH: 176 IU/L (ref 121–224)

## 2019-04-11 ENCOUNTER — Ambulatory Visit
Admission: RE | Admit: 2019-04-11 | Discharge: 2019-04-11 | Disposition: A | Discharge status: Home / Self Care | Payer: Medicare Other | Attending: Gastroenterology | Admitting: Gastroenterology

## 2019-04-11 ENCOUNTER — Other Ambulatory Visit: Payer: Self-pay

## 2019-04-11 ENCOUNTER — Ambulatory Visit: Payer: Medicare Other | Admitting: Certified Registered"

## 2019-04-11 ENCOUNTER — Encounter
Admission: RE | Disposition: A | Discharge status: Home / Self Care | Payer: Self-pay | Source: Home / Self Care | Attending: Gastroenterology

## 2019-04-11 ENCOUNTER — Encounter: Payer: Self-pay | Admitting: Gastroenterology

## 2019-04-11 DIAGNOSIS — K640 First degree hemorrhoids: Secondary | ICD-10-CM | POA: Diagnosis not present

## 2019-04-11 DIAGNOSIS — K5730 Diverticulosis of large intestine without perforation or abscess without bleeding: Secondary | ICD-10-CM | POA: Diagnosis not present

## 2019-04-11 DIAGNOSIS — E785 Hyperlipidemia, unspecified: Secondary | ICD-10-CM | POA: Diagnosis not present

## 2019-04-11 DIAGNOSIS — Z7982 Long term (current) use of aspirin: Secondary | ICD-10-CM | POA: Insufficient documentation

## 2019-04-11 DIAGNOSIS — Z79899 Other long term (current) drug therapy: Secondary | ICD-10-CM | POA: Insufficient documentation

## 2019-04-11 DIAGNOSIS — Z86010 Personal history of colonic polyps: Secondary | ICD-10-CM | POA: Diagnosis not present

## 2019-04-11 DIAGNOSIS — Z87891 Personal history of nicotine dependence: Secondary | ICD-10-CM | POA: Insufficient documentation

## 2019-04-11 DIAGNOSIS — Z7951 Long term (current) use of inhaled steroids: Secondary | ICD-10-CM | POA: Insufficient documentation

## 2019-04-11 DIAGNOSIS — Z1211 Encounter for screening for malignant neoplasm of colon: Secondary | ICD-10-CM | POA: Diagnosis not present

## 2019-04-11 HISTORY — PX: COLONOSCOPY WITH PROPOFOL: SHX5780

## 2019-04-11 HISTORY — DX: Personal history of colonic polyps: Z86.010

## 2019-04-11 HISTORY — DX: Personal history of colon polyps, unspecified: Z86.0100

## 2019-04-11 SURGERY — COLONOSCOPY WITH PROPOFOL
Anesthesia: General

## 2019-04-11 MED ORDER — PROPOFOL 10 MG/ML IV BOLUS
INTRAVENOUS | Status: DC | PRN
  Administered 2019-04-11: 90 mg via INTRAVENOUS
  Administered 2019-04-11: 30 mg via INTRAVENOUS
  Administered 2019-04-11: 20 mg via INTRAVENOUS
  Administered 2019-04-11: 40 mg via INTRAVENOUS
  Administered 2019-04-11: 20 mg via INTRAVENOUS
  Administered 2019-04-11 (×3): 30 mg via INTRAVENOUS

## 2019-04-11 MED ORDER — SODIUM CHLORIDE 0.9 % IV SOLN: INTRAVENOUS | Status: DC

## 2019-04-11 MED ORDER — PROPOFOL 10 MG/ML IV BOLUS
INTRAVENOUS | Status: AC
  Filled 2019-04-11: qty 20

## 2019-04-11 MED ORDER — SODIUM CHLORIDE 0.9 % IV SOLN
INTRAVENOUS | Status: DC
  Administered 2019-04-11: 10:00:00 1000 mL via INTRAVENOUS

## 2019-04-11 NOTE — Transfer of Care (Signed)
Immediate Anesthesia Transfer of Care Note  Patient: ARISTEDE BUCHMANN  Procedure(s) Performed: COLONOSCOPY WITH PROPOFOL (N/A )  Patient Location: Endoscopy Unit  Anesthesia Type:General  Level of Consciousness: awake, alert  and oriented  Airway & Oxygen Therapy: Patient Spontanous Breathing  Post-op Assessment: Report given to RN and Post -op Vital signs reviewed and stable  Post vital signs: Reviewed and stable  Last Vitals:  Vitals Value Taken Time  BP 153/85 04/11/19 0952  Temp 36.8 C 04/11/19 0952  Pulse 52 04/11/19 0952  Resp 20 04/11/19 0952  SpO2 98 % 04/11/19 0952    Last Pain:  Vitals:   04/11/19 0952  TempSrc: Tympanic  PainSc: 0-No pain         Complications: No apparent anesthesia complications

## 2019-04-11 NOTE — Anesthesia Post-op Follow-up Note (Signed)
Anesthesia QCDR form completed.        

## 2019-04-11 NOTE — H&P (Signed)
Outpatient short stay form Pre-procedure 04/11/2019 10:02 AM Lollie Sails MD  Primary Physician: Dr. Otilio Miu  Reason for visit: Colonoscopy  History of present illness: Patient is a 73 year old male presenting today for colonoscopy in regards to personal history of adenomatous colon polyps.  His last colonoscopy was in 2015 and did not show adenomas at that time but several hyperplastic polyps.  He takes 81 mg aspirin that was not taken today.  He takes no other aspirin product or blood thinning agent.  He tolerated his prep well.    Current Facility-Administered Medications:  .  0.9 %  sodium chloride infusion, , Intravenous, Continuous, Lollie Sails, MD .  0.9 %  sodium chloride infusion, , Intravenous, Continuous, Lollie Sails, MD  Medications Prior to Admission  Medication Sig Dispense Refill Last Dose  . Ascorbic Acid (VITAMIN C) 500 MG CAPS Take 1 capsule by mouth daily.   Past Week at Unknown time  . aspirin 81 MG tablet Take 1 tablet by mouth daily.   04/10/2019 at Unknown time  . finasteride (PROSCAR) 5 MG tablet Take 5 mg by mouth daily. Urology/ [facility]   04/10/2019 at Unknown time  . fluticasone (FLONASE) 50 MCG/ACT nasal spray Place 1 spray into both nostrils daily. 1 g 11 Past Week at Unknown time  . Glucosamine 500 MG CAPS Take 2 capsules by mouth once.    Past Week at Unknown time  . Multiple Vitamins-Minerals (MENS MULTIVITAMIN PLUS PO) Take 1 tablet by mouth daily.   Past Week at Unknown time  . Omega-3 Fatty Acids (FISH OIL) 1000 MG CAPS Take 1 capsule (1,000 mg total) by mouth daily. 30 capsule 11 Past Week at Unknown time  . sildenafil (REVATIO) 20 MG tablet Take 20 mg by mouth as needed. Urology/ [facility]   Past Week at Unknown time  . zinc gluconate 50 MG tablet Take 50 mg by mouth daily.   Past Week at Unknown time     No Known Allergies   Past Medical History:  Diagnosis Date  . History of colon polyps   . Hyperlipidemia     Review of  systems:      Physical Exam    Heart and lungs: Regular rate and rhythm without rub or gallop lungs are bilaterally clear    HEENT: Normocephalic atraumatic eyes are anicteric    Other:    Pertinant exam for procedure: Soft nontender nondistended bowel sounds positive normoactive.    Planned proceedures: Colonoscopy and indicated procedures. I have discussed the risks benefits and complications of procedures to include not limited to bleeding, infection, perforation and the risk of sedation and the patient wishes to proceed.    Lollie Sails, MD Gastroenterology 04/11/2019  10:02 AM

## 2019-04-11 NOTE — Anesthesia Preprocedure Evaluation (Signed)
Anesthesia Evaluation  Patient identified by MRN, date of birth, ID band Patient awake    Reviewed: Allergy & Precautions, NPO status , Patient's Chart, lab work & pertinent test results  History of Anesthesia Complications Negative for: history of anesthetic complications  Airway Mallampati: II       Dental   Pulmonary neg sleep apnea, neg COPD, former smoker,           Cardiovascular (-) hypertension(-) Past MI and (-) CHF (-) dysrhythmias (-) Valvular Problems/Murmurs     Neuro/Psych neg Seizures    GI/Hepatic Neg liver ROS, neg GERD  ,  Endo/Other  neg diabetes  Renal/GU negative Renal ROS     Musculoskeletal   Abdominal   Peds  Hematology   Anesthesia Other Findings   Reproductive/Obstetrics                             Anesthesia Physical Anesthesia Plan  ASA: II  Anesthesia Plan: General   Post-op Pain Management:    Induction: Intravenous  PONV Risk Score and Plan: 2  Airway Management Planned: Nasal Cannula  Additional Equipment:   Intra-op Plan:   Post-operative Plan:   Informed Consent: I have reviewed the patients History and Physical, chart, labs and discussed the procedure including the risks, benefits and alternatives for the proposed anesthesia with the patient or authorized representative who has indicated his/her understanding and acceptance.       Plan Discussed with:   Anesthesia Plan Comments:         Anesthesia Quick Evaluation

## 2019-04-11 NOTE — Op Note (Signed)
[facility] Gastroenterology Patient Name: Richard Stafford Procedure Date: 04/11/2019 10:14 AM MRN: [id number] Account #: [id number] Date of Birth: 11-13-1945 Admit Type: Outpatient Age: 73 Room: [facility] ENDO ROOM 3 Gender: Male Note Status: Finalized Procedure:            Colonoscopy Indications:          Personal history of colonic polyps Providers:            Lollie Sails, MD Referring MD:         Juline Patch, MD (Referring MD) Medicines:            Monitored Anesthesia Care Complications:        No immediate complications. Procedure:            Pre-Anesthesia Assessment:                       - ASA Grade Assessment: II - A patient with mild                        systemic disease.                       After obtaining informed consent, the colonoscope was                        passed under direct vision. Throughout the procedure,                        the patient's blood pressure, pulse, and oxygen                        saturations were monitored continuously. The                        Colonoscope was introduced through the anus and                        advanced to the the cecum, identified by appendiceal                        orifice and ileocecal valve. The colonoscopy was                        performed without difficulty. The patient tolerated the                        procedure well. The quality of the bowel preparation                        was good. Findings:      Multiple small-mouthed diverticula were found in the sigmoid colon and       descending colon.      Non-bleeding internal hemorrhoids were found during retroflexion and       during anoscopy. The hemorrhoids were small and Grade I (internal       hemorrhoids that do not prolapse).      No additional abnormalities were found on retroflexion. Impression:           - Diverticulosis in the sigmoid colon and in the  descending colon.                       -  Non-bleeding internal hemorrhoids.                       - No specimens collected. Recommendation:       - Discharge patient to home.                       - Repeat colonoscopy in 5 years for surveillance.                       - Advance diet as tolerated. Procedure Code(s):    --- Professional ---                       [phone], Colonoscopy, flexible; diagnostic, including                        collection of specimen(s) by brushing or washing, when                        performed (separate procedure) Diagnosis Code(s):    --- Professional ---                       K64.0, First degree hemorrhoids                       Z86.010, Personal history of colonic polyps                       K57.30, Diverticulosis of large intestine without                        perforation or abscess without bleeding CPT copyright 2019 [employer]. All rights reserved. The codes documented in this report are preliminary and upon coder review may  be revised to meet current compliance requirements. Lollie Sails, MD 04/11/2019 10:44:03 AM This report has been signed electronically. Number of Addenda: 0 Note Initiated On: 04/11/2019 10:14 AM Scope Withdrawal Time: 0 hours 6 minutes 34 seconds  Total Procedure Duration: 0 hours 20 minutes 19 seconds       [facility]

## 2019-04-11 NOTE — Anesthesia Postprocedure Evaluation (Signed)
Anesthesia Post Note  Patient: Richard Stafford  Procedure(s) Performed: COLONOSCOPY WITH PROPOFOL (N/A )  Patient location during evaluation: Endoscopy Anesthesia Type: General Level of consciousness: awake and alert Pain management: pain level controlled Vital Signs Assessment: post-procedure vital signs reviewed and stable Respiratory status: spontaneous breathing and respiratory function stable Cardiovascular status: stable Anesthetic complications: no     Last Vitals:  Vitals:   04/11/19 1043 04/11/19 1053  BP: 103/64 126/77  Pulse: (!) 46 (!) 47  Resp: 18 15  Temp: (!) 36.4 C   SpO2: 97% 98%    Last Pain:  Vitals:   04/11/19 1053  TempSrc:   PainSc: 0-No pain                 [place] K

## 2020-03-04 DIAGNOSIS — H2513 Age-related nuclear cataract, bilateral: Secondary | ICD-10-CM | POA: Diagnosis not present

## 2020-04-08 ENCOUNTER — Other Ambulatory Visit: Payer: Self-pay

## 2020-04-08 ENCOUNTER — Encounter: Payer: Self-pay | Admitting: Family Medicine

## 2020-04-08 ENCOUNTER — Ambulatory Visit (INDEPENDENT_AMBULATORY_CARE_PROVIDER_SITE_OTHER): Payer: Medicare PPO | Admitting: Family Medicine

## 2020-04-08 VITALS — BP 130/62 | HR 56 | Ht 72.0 in | Wt 237.0 lb

## 2020-04-08 DIAGNOSIS — R7401 Elevation of levels of liver transaminase levels: Secondary | ICD-10-CM

## 2020-04-08 DIAGNOSIS — Z1211 Encounter for screening for malignant neoplasm of colon: Secondary | ICD-10-CM | POA: Diagnosis not present

## 2020-04-08 DIAGNOSIS — Z0000 Encounter for general adult medical examination without abnormal findings: Secondary | ICD-10-CM

## 2020-04-08 DIAGNOSIS — D649 Anemia, unspecified: Secondary | ICD-10-CM | POA: Diagnosis not present

## 2020-04-08 DIAGNOSIS — R001 Bradycardia, unspecified: Secondary | ICD-10-CM | POA: Diagnosis not present

## 2020-04-08 DIAGNOSIS — R0600 Dyspnea, unspecified: Secondary | ICD-10-CM

## 2020-04-08 DIAGNOSIS — E785 Hyperlipidemia, unspecified: Secondary | ICD-10-CM | POA: Diagnosis not present

## 2020-04-08 DIAGNOSIS — R5383 Other fatigue: Secondary | ICD-10-CM | POA: Diagnosis not present

## 2020-04-08 DIAGNOSIS — R0609 Other forms of dyspnea: Secondary | ICD-10-CM

## 2020-04-08 DIAGNOSIS — R7402 Elevation of levels of lactic acid dehydrogenase (LDH): Secondary | ICD-10-CM | POA: Diagnosis not present

## 2020-04-08 LAB — HEMOCCULT GUIAC POC 1CARD (OFFICE): Fecal Occult Blood, POC: NEGATIVE

## 2020-04-08 NOTE — Progress Notes (Signed)
Date:  04/08/2020   Name:  Richard Stafford   DOB:  03-30-1946   MRN:  [id number]   Chief Complaint: Annual Exam  Patient is a 74 year old male who presents for a comprehensive physical exam. The patient reports the following problems: fatigue. Health maintenance has been reviewed up to date.  Thyroid Problem Presents for initial visit. Symptoms include fatigue. Patient reports no anxiety, cold intolerance, constipation, depressed mood, diarrhea, dry skin, hair loss, heat intolerance, hoarse voice, leg swelling, nail problem, palpitations, tremors, visual change, weight gain or weight loss. The symptoms have been stable.  Congestive Heart Failure Presents for initial visit. Associated symptoms include fatigue and nocturia. Pertinent negatives include no abdominal pain, chest pain, chest pressure, edema, near-syncope, orthopnea, palpitations, paroxysmal nocturnal dyspnea or shortness of breath. Past treatments include salt and fluid restriction. Compliance with prior treatments has been good.    Lab Results  Component Value Date   CREATININE 1.12 04/07/2019   BUN 13 04/07/2019   NA 141 04/07/2019   K 4.6 04/07/2019   CL 102 04/07/2019   CO2 24 04/07/2019   Lab Results  Component Value Date   CHOL 179 04/07/2019   HDL 48 04/07/2019   LDLCALC 97 04/07/2019   TRIG 171 (H) 04/07/2019   CHOLHDL 3.8 04/04/2018   No results found for: TSH No results found for: HGBA1C Lab Results  Component Value Date   WBC 5.0 04/07/2019   HGB 13.0 04/07/2019   HCT 38.2 04/07/2019   MCV 84 04/07/2019   PLT 295 04/07/2019   Lab Results  Component Value Date   ALT 31 04/07/2019   AST 40 04/07/2019   ALKPHOS 71 04/07/2019   BILITOT 0.6 04/07/2019     Review of Systems  Constitutional: Positive for fatigue. Negative for chills, fever, weight gain and weight loss.  HENT: Negative for drooling, ear discharge, ear pain, hoarse voice and sore throat.   Respiratory: Negative for cough, shortness  of breath and wheezing.   Cardiovascular: Negative for chest pain, palpitations, leg swelling and near-syncope.  Gastrointestinal: Negative for abdominal pain, blood in stool, constipation, diarrhea and nausea.  Endocrine: Negative for cold intolerance, heat intolerance and polydipsia.  Genitourinary: Positive for nocturia. Negative for dysuria, frequency, hematuria and urgency.  Musculoskeletal: Negative for back pain, myalgias and neck pain.       Nocturia  Skin: Negative for rash.  Allergic/Immunologic: Negative for environmental allergies.  Neurological: Negative for dizziness, tremors and headaches.  Hematological: Negative for adenopathy. Does not bruise/bleed easily.  Psychiatric/Behavioral: Negative for suicidal ideas. The patient is not nervous/anxious.     Patient Active Problem List   Diagnosis Date Noted  . Need for hepatitis C screening test 04/02/2017  . Elevated blood pressure, situational 12/21/2014  . Routine general medical examination at a health care facility 12/21/2014  . Elevation of level of transaminase and lactic acid dehydrogenase (LDH) 12/21/2014  . 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 HLD (hyperlipidemia) 12/21/2014    No Known Allergies  Past Surgical History:  Procedure Laterality Date  . COLONOSCOPY  2014   cleared for 5 years- [facility] docs  . COLONOSCOPY WITH PROPOFOL N/A 04/11/2019   Procedure: COLONOSCOPY WITH PROPOFOL;  Surgeon: Lollie Sails, MD;  Location: [facility] ENDOSCOPY;  Service: Endoscopy;  Laterality: N/A;  . CYST EXCISION     cyst in maxillary sinus removed  . HERNIA REPAIR    . NASAL ENDOSCOPY Bilateral 01/07/2018  . TOOTH EXTRACTION    .  varicose veins      Social History   Tobacco Use  . Smoking status: Former Smoker    Packs/day: 1.00    Years: 4.00    Pack years: 4.00    Types: Cigarettes    Quit date: 1988    Years since quitting: 33.6  . Smokeless tobacco: Never Used  . Tobacco comment: smoking  cessation materials not required  Vaping Use  . Vaping Use: Never used  Substance Use Topics  . Alcohol use: Yes    Alcohol/week: 13.0 standard drinks    Types: 7 Glasses of wine, 6 Cans of beer per week  . Drug use: No     Medication list has been reviewed and updated.  Current Meds  Medication Sig  . Ascorbic Acid (VITAMIN C) 500 MG CAPS Take 1 capsule by mouth daily.  Marland Kitchen aspirin 81 MG tablet Take 1 tablet by mouth daily.  . finasteride (PROSCAR) 5 MG tablet Take 5 mg by mouth daily. Urology/ [facility]  . Glucosamine 500 MG CAPS Take 2 capsules by mouth once.   . Multiple Vitamins-Minerals (MENS MULTIVITAMIN PLUS PO) Take 1 tablet by mouth daily.  . Omega-3 Fatty Acids (FISH OIL) 1000 MG CAPS Take 1 capsule (1,000 mg total) by mouth daily.  . sildenafil (REVATIO) 20 MG tablet Take 20 mg by mouth as needed. Urology/ [facility]  . zinc gluconate 50 MG tablet Take 50 mg by mouth daily.    PHQ 2/9 Scores 04/08/2020 04/07/2019 04/04/2018 10/21/2017  PHQ - 2 Score 0 0 0 0  PHQ- 9 Score 1 0 1 0    GAD 7 : Generalized Anxiety Score 04/08/2020  Nervous, Anxious, on Edge 0  Control/stop worrying 0  Worry too much - different things 0  Trouble relaxing 0  Restless 0  Easily annoyed or irritable 0  Afraid - awful might happen 0  Total GAD 7 Score 0    BP Readings from Last 3 Encounters:  04/08/20 130/62  04/11/19 126/77  04/07/19 120/66    Physical Exam Vitals and nursing note reviewed.  Constitutional:      Appearance: Normal appearance. He is well-developed and well-groomed. He is obese.  HENT:     Head: Normocephalic.     Jaw: There is normal jaw occlusion.     Right Ear: Hearing, tympanic membrane, ear canal and external ear normal.     Left Ear: Hearing, tympanic membrane, ear canal and external ear normal.     Nose: Nose normal. No congestion or rhinorrhea.     Right Turbinates: Not enlarged, swollen or pale.     Left Turbinates: Not enlarged, swollen or pale.     Mouth/Throat:      Lips: Pink.     Mouth: Mucous membranes are dry.     Dentition: Normal dentition.     Tongue: No lesions.     Palate: No mass.     Pharynx: Oropharynx is clear.  Eyes:     General: Lids are normal. Vision grossly intact. Gaze aligned appropriately. No scleral icterus.       Right eye: No discharge.        Left eye: No discharge.     Extraocular Movements: Extraocular movements intact.     Right eye: Normal extraocular motion and no nystagmus.     Left eye: Normal extraocular motion and no nystagmus.     Conjunctiva/sclera: Conjunctivae normal.     Pupils: Pupils are equal, round, and reactive to light.  Funduscopic exam:    Right eye: Red reflex present.        Left eye: Red reflex present. Neck:     Thyroid: No thyroid mass, thyromegaly or thyroid tenderness.     Vascular: No carotid bruit, hepatojugular reflux or JVD.     Trachea: Trachea normal. No tracheal deviation.  Cardiovascular:     Rate and Rhythm: Normal rate and regular rhythm.     Chest Wall: PMI is not displaced.     Pulses: Normal pulses.          Carotid pulses are 2+ on the right side and 2+ on the left side.      Radial pulses are 2+ on the right side and 2+ on the left side.       Femoral pulses are 2+ on the right side and 2+ on the left side.      Popliteal pulses are 2+ on the right side and 2+ on the left side.       Dorsalis pedis pulses are 2+ on the right side and 2+ on the left side.       Posterior tibial pulses are 2+ on the right side and 2+ on the left side.     Heart sounds: Normal heart sounds, S1 normal and S2 normal. No murmur heard.  No systolic murmur is present.  No diastolic murmur is present.  No friction rub. No gallop. No S3 or S4 sounds.   Pulmonary:     Effort: Pulmonary effort is normal. No respiratory distress.     Breath sounds: Normal breath sounds. No decreased breath sounds, wheezing, rhonchi or rales.  Chest:     Chest wall: No mass.     Breasts: Breasts are  symmetrical.        Right: Normal.        Left: Normal.  Abdominal:     General: Bowel sounds are normal.     Palpations: Abdomen is soft. There is no hepatomegaly, splenomegaly or mass.     Tenderness: There is no abdominal tenderness. There is no guarding or rebound.     Hernia: There is no hernia in the umbilical area, ventral area, left inguinal area or right inguinal area.  Genitourinary:    Penis: Normal and uncircumcised.      Testes: Normal.        Right: Mass, tenderness or swelling not present.        Left: Mass, tenderness or swelling not present.  Musculoskeletal:        General: No tenderness. Normal range of motion.     Cervical back: Normal, full passive range of motion without pain, normal range of motion and neck supple.     Thoracic back: Normal.     Lumbar back: Normal.     Right lower leg: No edema.     Left lower leg: No edema.  Lymphadenopathy:     Head:     Right side of head: No submental or submandibular adenopathy.     Left side of head: No submental or submandibular adenopathy.     Cervical: No cervical adenopathy.     Right cervical: No superficial or deep cervical adenopathy.    Left cervical: No superficial or deep cervical adenopathy.     Upper Body:     Right upper body: No supraclavicular adenopathy.     Left upper body: No supraclavicular adenopathy.  Skin:    General: Skin is warm.  Capillary Refill: Capillary refill takes less than 2 seconds.     Findings: No rash.  Neurological:     General: No focal deficit present.     Mental Status: He is alert and oriented to person, place, and time.     Cranial Nerves: Cranial nerves are intact. No cranial nerve deficit.     Motor: Motor function is intact.     Deep Tendon Reflexes: Reflexes are normal and symmetric.  Psychiatric:        Behavior: Behavior is cooperative.     Wt Readings from Last 3 Encounters:  04/08/20 237 lb (107.5 kg)  04/11/19 237 lb (107.5 kg)  04/07/19 244 lb (110.7  kg)    BP 130/62   Pulse (!) 56   Ht 6' (1.829 m)   Wt 237 lb (107.5 kg)   BMI 32.14 kg/m   Assessment and Plan: 1. Annual physical exam No subjective/objective concerns noted during history and physical exam.  Patient's chart was reviewed for previous encounters, most recent labs, most recent imaging, as well as care everywhere.ABDULAI BLAYLOCK is a 74 y.o. male who presents today for his Complete Annual Exam. He feels fairly well. He reports exercising outside work. He reports he is sleeping fairly well. Immunizations are reviewed and recommendations provided.   Age appropriate screening tests are discussed. Counseling given for risk factor reduction interventions.  Lab work will be conducted looking at [facility] CBC and thyroid panel.  Patient was noted to have mild elevation of transaminases in the past and will look at review of these as well. - Comprehensive metabolic panel - CBC with Differential/Platelet - Thyroid Panel With TSH  2. Hyperlipidemia, unspecified hyperlipidemia type Chronic.  Controlled.  Stable.  Patient is currently using dietary control and omega-3.  Will check lipid panel for current status of control. - Lipid Panel With LDL/HDL Ratio  3. Bradycardia Patient relates that he has been feeling more fatigued and tired was wondering better heart block rhythm was noted to be normal.  EKG was done:I have reviewed EKG which shows sinus rhythm with a rate of 52 consistent with sinus bradycardia.  Intervals are normal PR, QT, and QRS.  There is no evidence of LVH by EKG criteria no evidence of any EKG ischemic changes.  Axis is -15. Comparison to previous not applicable.  EKG dated not applicable. - EKG 12-Lead  4. Fatigue, unspecified type Patient's had more fatigue however he has been working outdoors we will check a fatigue evaluation with CMP, CBC, thyroid panel,. - Comprehensive metabolic panel - CBC with Differential/Platelet - Thyroid Panel With TSH - Lipid Panel With  LDL/HDL Ratio  5. DOE (dyspnea on exertion) Patient has been working outdoors and has been more susceptible to fatigue and shortness of breath with activity review of EKG is unremarkable - EKG 12-Lead  6. Elevation of level of transaminase and lactic acid dehydrogenase (LDH) Patient with history of transaminase elevated in the past and we will repeat a CMP to look at transaminases. - Comprehensive metabolic panel  7. Colon cancer screening Discussed with patient and Hemoccult was negative - POCT Occult Blood Stool

## 2020-04-09 DIAGNOSIS — N401 Enlarged prostate with lower urinary tract symptoms: Secondary | ICD-10-CM | POA: Diagnosis not present

## 2020-04-09 DIAGNOSIS — R972 Elevated prostate specific antigen [PSA]: Secondary | ICD-10-CM | POA: Diagnosis not present

## 2020-04-09 DIAGNOSIS — N400 Enlarged prostate without lower urinary tract symptoms: Secondary | ICD-10-CM | POA: Diagnosis not present

## 2020-04-09 DIAGNOSIS — Z6832 Body mass index (BMI) 32.0-32.9, adult: Secondary | ICD-10-CM | POA: Diagnosis not present

## 2020-04-09 DIAGNOSIS — N138 Other obstructive and reflux uropathy: Secondary | ICD-10-CM | POA: Diagnosis not present

## 2020-04-09 LAB — CBC WITH DIFFERENTIAL/PLATELET
Basophils Absolute: 0.1 10*3/uL (ref 0.0–0.2)
Basos: 1 %
EOS (ABSOLUTE): 0.3 10*3/uL (ref 0.0–0.4)
Eos: 6 %
Hematocrit: 37.8 % (ref 37.5–51.0)
Hemoglobin: 12.2 g/dL — ABNORMAL LOW (ref 13.0–17.7)
Immature Grans (Abs): 0 10*3/uL (ref 0.0–0.1)
Immature Granulocytes: 0 %
Lymphocytes Absolute: 1.9 10*3/uL (ref 0.7–3.1)
Lymphs: 32 %
MCH: 26.7 pg (ref 26.6–33.0)
MCHC: 32.3 g/dL (ref 31.5–35.7)
MCV: 83 fL (ref 79–97)
Monocytes Absolute: 0.9 10*3/uL (ref 0.1–0.9)
Monocytes: 15 %
Neutrophils Absolute: 2.7 10*3/uL (ref 1.4–7.0)
Neutrophils: 46 %
Platelets: 340 10*3/uL (ref 150–450)
RBC: 4.57 x10E6/uL (ref 4.14–5.80)
RDW: 14.8 % (ref 11.6–15.4)
WBC: 5.9 10*3/uL (ref 3.4–10.8)

## 2020-04-09 LAB — COMPREHENSIVE METABOLIC PANEL
ALT: 27 IU/L (ref 0–44)
AST: 35 IU/L (ref 0–40)
Albumin/Globulin Ratio: 1.7 (ref 1.2–2.2)
Albumin: 4.5 g/dL (ref 3.7–4.7)
Alkaline Phosphatase: 85 IU/L (ref 48–121)
BUN/Creatinine Ratio: 13 (ref 10–24)
BUN: 14 mg/dL (ref 8–27)
Bilirubin Total: 0.6 mg/dL (ref 0.0–1.2)
CO2: 24 mmol/L (ref 20–29)
Calcium: 9.1 mg/dL (ref 8.6–10.2)
Chloride: 99 mmol/L (ref 96–106)
Creatinine, Ser: 1.07 mg/dL (ref 0.76–1.27)
GFR calc Af Amer: 80 mL/min/{1.73_m2} (ref >59)
GFR calc non Af Amer: 69 mL/min/{1.73_m2} (ref >59)
Globulin, Total: 2.7 g/dL (ref 1.5–4.5)
Glucose: 92 mg/dL (ref 65–99)
Potassium: 4.8 mmol/L (ref 3.5–5.2)
Sodium: 138 mmol/L (ref 134–144)
Total Protein: 7.2 g/dL (ref 6.0–8.5)

## 2020-04-09 LAB — THYROID PANEL WITH TSH
Free Thyroxine Index: 1.4 (ref 1.2–4.9)
T3 Uptake Ratio: 27 % (ref 24–39)
T4, Total: 5.3 ug/dL (ref 4.5–12.0)
TSH: 4.57 u[IU]/mL — ABNORMAL HIGH (ref 0.450–4.500)

## 2020-04-09 LAB — LIPID PANEL WITH LDL/HDL RATIO
Cholesterol, Total: 164 mg/dL (ref 100–199)
HDL: 43 mg/dL (ref >39)
LDL Chol Calc (NIH): 96 mg/dL (ref 0–99)
LDL/HDL Ratio: 2.2 ratio (ref 0.0–3.6)
Triglycerides: 140 mg/dL (ref 0–149)
VLDL Cholesterol Cal: 25 mg/dL (ref 5–40)

## 2020-04-10 ENCOUNTER — Other Ambulatory Visit: Payer: Self-pay

## 2020-04-10 DIAGNOSIS — D509 Iron deficiency anemia, unspecified: Secondary | ICD-10-CM

## 2020-04-10 LAB — FERRITIN: Ferritin: 14 ng/mL — ABNORMAL LOW (ref 30–400)

## 2020-04-10 LAB — SPECIMEN STATUS REPORT

## 2020-04-10 MED ORDER — IRON (FERROUS SULFATE) 325 (65 FE) MG PO TABS: 1.0000 | ORAL_TABLET | Freq: Every day | ORAL | 5 refills | Status: DC

## 2020-04-10 NOTE — Progress Notes (Unsigned)
Sent in ferrous sulfate °

## 2020-04-12 ENCOUNTER — Other Ambulatory Visit (INDEPENDENT_AMBULATORY_CARE_PROVIDER_SITE_OTHER): Payer: Medicare PPO

## 2020-04-12 DIAGNOSIS — D509 Iron deficiency anemia, unspecified: Secondary | ICD-10-CM | POA: Diagnosis not present

## 2020-04-12 LAB — HEMOCCULT GUIAC POC 1CARD (OFFICE)
Card #2 Fecal Occult Blod, POC: NEGATIVE
Card #3 Fecal Occult Blood, POC: NEGATIVE
Fecal Occult Blood, POC: NEGATIVE

## 2020-04-12 NOTE — Progress Notes (Signed)
3 out of 3 occults= negative

## 2020-08-13 ENCOUNTER — Other Ambulatory Visit: Payer: Self-pay

## 2020-08-13 ENCOUNTER — Ambulatory Visit (INDEPENDENT_AMBULATORY_CARE_PROVIDER_SITE_OTHER): Payer: Medicare PPO | Admitting: Family Medicine

## 2020-08-13 ENCOUNTER — Encounter: Payer: Self-pay | Admitting: Family Medicine

## 2020-08-13 VITALS — BP 122/80 | HR 64 | Ht 72.0 in | Wt 232.0 lb

## 2020-08-13 DIAGNOSIS — K429 Umbilical hernia without obstruction or gangrene: Secondary | ICD-10-CM | POA: Diagnosis not present

## 2020-08-13 NOTE — Progress Notes (Signed)
Date:  08/13/2020   Name:  Richard Stafford   DOB:  Dec 11, 1945   MRN:  [id number]   Chief Complaint: Umbilical Hernia (A little uncomfortable- noticed it yesterday morning while taking a shower- is able to push it in, but it pops back out.)  Patient is a 75 year old male who presents for a umbilical hernia exam. The patient reports the following problems: see above. Health maintenance has been reviewed up to date   Lab Results  Component Value Date   CREATININE 1.07 04/08/2020   BUN 14 04/08/2020   NA 138 04/08/2020   K 4.8 04/08/2020   CL 99 04/08/2020   CO2 24 04/08/2020   Lab Results  Component Value Date   CHOL 164 04/08/2020   HDL 43 04/08/2020   LDLCALC 96 04/08/2020   TRIG 140 04/08/2020   CHOLHDL 3.8 04/04/2018   Lab Results  Component Value Date   TSH 4.570 (H) 04/08/2020   No results found for: HGBA1C Lab Results  Component Value Date   WBC 5.9 04/08/2020   HGB 12.2 (L) 04/08/2020   HCT 37.8 04/08/2020   MCV 83 04/08/2020   PLT 340 04/08/2020   Lab Results  Component Value Date   ALT 27 04/08/2020   AST 35 04/08/2020   ALKPHOS 85 04/08/2020   BILITOT 0.6 04/08/2020     Review of Systems  Constitutional: Negative for chills and fever.  HENT: Negative for drooling, ear discharge, ear pain and sore throat.   Respiratory: Negative for cough, shortness of breath and wheezing.   Cardiovascular: Negative for chest pain, palpitations and leg swelling.  Gastrointestinal: Negative for abdominal pain, blood in stool, constipation, diarrhea and nausea.  Endocrine: Negative for polydipsia.  Genitourinary: Negative for dysuria, frequency, hematuria and urgency.  Musculoskeletal: Negative for back pain, myalgias and neck pain.  Skin: Negative for rash.  Allergic/Immunologic: Negative for environmental allergies.  Neurological: Negative for dizziness and headaches.  Hematological: Does not bruise/bleed easily.  Psychiatric/Behavioral: Negative for suicidal  ideas. The patient is not nervous/anxious.     Patient Active Problem List   Diagnosis Date Noted  . Need for hepatitis C screening test 04/02/2017  . Elevated blood pressure, situational 12/21/2014  . Routine general medical examination at a health care facility 12/21/2014  . Elevation of level of transaminase and lactic acid dehydrogenase (LDH) 12/21/2014  . 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 HLD (hyperlipidemia) 12/21/2014    No Known Allergies  Past Surgical History:  Procedure Laterality Date  . COLONOSCOPY  2014   cleared for 5 years- [facility] docs  . COLONOSCOPY WITH PROPOFOL N/A 04/11/2019   Procedure: COLONOSCOPY WITH PROPOFOL;  Surgeon: Lollie Sails, MD;  Location: [facility] ENDOSCOPY;  Service: Endoscopy;  Laterality: N/A;  . CYST EXCISION     cyst in maxillary sinus removed  . HERNIA REPAIR    . NASAL ENDOSCOPY Bilateral 01/07/2018  . TOOTH EXTRACTION    . varicose veins      Social History   Tobacco Use  . Smoking status: Former Smoker    Packs/day: 1.00    Years: 4.00    Pack years: 4.00    Types: Cigarettes    Quit date: 1988    Years since quitting: 34.0  . Smokeless tobacco: Never Used  . Tobacco comment: smoking cessation materials not required  Vaping Use  . Vaping Use: Never used  Substance Use Topics  . Alcohol use: Yes  Alcohol/week: 13.0 standard drinks    Types: 7 Glasses of wine, 6 Cans of beer per week  . Drug use: No     Medication list has been reviewed and updated.  Current Meds  Medication Sig  . Ascorbic Acid (VITAMIN C) 500 MG CAPS Take 1 capsule by mouth daily.  Marland Kitchen aspirin 81 MG tablet Take 1 tablet by mouth daily.  . finasteride (PROSCAR) 5 MG tablet Take 5 mg by mouth daily. Urology/ [facility]  . Glucosamine 500 MG CAPS Take 2 capsules by mouth once.   . Iron, Ferrous Sulfate, 325 (65 Fe) MG TABS Take 1 tablet by mouth daily.  . Multiple Vitamins-Minerals (MENS MULTIVITAMIN PLUS PO) Take 1 tablet by mouth  daily.  . Omega-3 Fatty Acids (FISH OIL) 1000 MG CAPS Take 1 capsule (1,000 mg total) by mouth daily.  . sildenafil (REVATIO) 20 MG tablet Take 20 mg by mouth as needed. Urology/ [facility]  . zinc gluconate 50 MG tablet Take 50 mg by mouth daily.    PHQ 2/9 Scores 04/08/2020 04/07/2019 04/04/2018 10/21/2017  PHQ - 2 Score 0 0 0 0  PHQ- 9 Score 1 0 1 0    GAD 7 : Generalized Anxiety Score 04/08/2020  Nervous, Anxious, on Edge 0  Control/stop worrying 0  Worry too much - different things 0  Trouble relaxing 0  Restless 0  Easily annoyed or irritable 0  Afraid - awful might happen 0  Total GAD 7 Score 0    BP Readings from Last 3 Encounters:  08/13/20 122/80  04/08/20 130/62  04/11/19 126/77    Physical Exam Vitals and nursing note reviewed.  HENT:     Head: Normocephalic.     Right Ear: Tympanic membrane, ear canal and external ear normal.     Left Ear: Tympanic membrane, ear canal and external ear normal.     Nose: Nose normal.     Mouth/Throat:     Mouth: Oropharynx is clear and moist.  Eyes:     General: No scleral icterus.       Right eye: No discharge.        Left eye: No discharge.     Extraocular Movements: EOM normal.     Conjunctiva/sclera: Conjunctivae normal.     Pupils: Pupils are equal, round, and reactive to light.  Neck:     Thyroid: No thyromegaly.     Vascular: No JVD.     Trachea: No tracheal deviation.  Cardiovascular:     Rate and Rhythm: Normal rate and regular rhythm.     Pulses: Intact distal pulses.     Heart sounds: Normal heart sounds. No murmur heard. No friction rub. No gallop.   Pulmonary:     Effort: No respiratory distress.     Breath sounds: Normal breath sounds. No wheezing, rhonchi or rales.  Abdominal:     General: Bowel sounds are normal.     Palpations: Abdomen is soft. There is no hepatosplenomegaly or mass.     Tenderness: There is no abdominal tenderness. There is no CVA tenderness, guarding or rebound.  Musculoskeletal:         General: No tenderness or edema. Normal range of motion.     Cervical back: Normal range of motion and neck supple.  Lymphadenopathy:     Cervical: No cervical adenopathy.  Skin:    General: Skin is warm.     Findings: No bruising, erythema or rash.  Neurological:     Mental Status:  He is alert and oriented to person, place, and time.     Cranial Nerves: No cranial nerve deficit.     Deep Tendon Reflexes: Strength normal and reflexes are normal and symmetric.     Wt Readings from Last 3 Encounters:  08/13/20 232 lb (105.2 kg)  04/08/20 237 lb (107.5 kg)  04/11/19 237 lb (107.5 kg)    BP 122/80   Pulse 64   Ht 6' (1.829 m)   Wt 232 lb (105.2 kg)   BMI 31.46 kg/m   Assessment and Plan:  1. Umbilical hernia without obstruction and without gangrene New onset.  Reducible without incarceration.  Stable.  Patient is having more issues with the discomfort when he is active in the yard.  There is some concern that there may be issues when traveling abroad.  Would like to have evaluation for information if repair is necessary in terms of timing and accessibility. - Ambulatory referral to General Surgery

## 2020-08-13 NOTE — Patient Instructions (Signed)
Umbilical Hernia, Adult  A hernia is a bulge of tissue that pushes through an opening between muscles. An umbilical hernia happens in the abdomen, near the belly button (umbilicus). The hernia may contain tissues from the small intestine, large intestine, or fatty tissue covering the intestines (omentum). Umbilical hernias in adults tend to get worse over time, and they require surgical treatment. There are several types of umbilical hernias. You may have:  A hernia located just above or below the umbilicus (indirect hernia). This is the most common type of umbilical hernia in adults.  A hernia that forms through an opening formed by the umbilicus (direct hernia).  A hernia that comes and goes (reducible hernia). A reducible hernia may be visible only when you strain, lift something heavy, or cough. This type of hernia can be pushed back into the abdomen (reduced).  A hernia that traps abdominal tissue inside the hernia (incarcerated hernia). This type of hernia cannot be reduced.  A hernia that cuts off blood flow to the tissues inside the hernia (strangulated hernia). The tissues can start to die if this happens. This type of hernia requires emergency treatment. What are the causes? An umbilical hernia happens when tissue inside the abdomen presses on a weak area of the abdominal muscles. What increases the risk? You may have a greater risk of this condition if you:  Are obese.  Have had several pregnancies.  Have a buildup of fluid inside your abdomen (ascites).  Have had surgery that weakens the abdominal muscles. What are the signs or symptoms? The main symptom of this condition is a painless bulge at or near the belly button. A reducible hernia may be visible only when you strain, lift something heavy, or cough. Other symptoms may include:  Dull pain.  A feeling of pressure. Symptoms of a strangulated hernia may include:  Pain that gets increasingly worse.  Nausea and  vomiting.  Pain when pressing on the hernia.  Skin over the hernia becoming red or purple.  Constipation.  Blood in the stool. How is this diagnosed? This condition may be diagnosed based on:  A physical exam. You may be asked to cough or strain while standing. These actions increase the pressure inside your abdomen and force the hernia through the opening in your muscles. Your health care provider may try to reduce the hernia by pressing on it.  Your symptoms and medical history. How is this treated? Surgery is the only treatment for an umbilical hernia. Surgery for a strangulated hernia is done as soon as possible. If you have a small hernia that is not incarcerated, you may need to lose weight before having surgery. Follow these instructions at home:  Lose weight, if told by your health care provider.  Do not try to push the hernia back in.  Watch your hernia for any changes in color or size. Tell your health care provider if any changes occur.  You may need to avoid activities that increase pressure on your hernia.  Do not lift anything that is heavier than 10 lb (4.5 kg) until your health care provider says that this is safe.  Take over-the-counter and prescription medicines only as told by your health care provider.  Keep all follow-up visits as told by your health care provider. This is important. Contact a health care provider if:  Your hernia gets larger.  Your hernia becomes painful. Get help right away if:  You develop sudden, severe pain near the area of your hernia.    You have pain as well as nausea or vomiting.  You have pain and the skin over your hernia changes color.  You develop a fever. This information is not intended to replace advice given to you by your health care provider. Make sure you discuss any questions you have with your health care provider. Document Revised: 09/08/2017 Document Reviewed: 01/25/2017 Elsevier Patient Education  2020  [employer]  

## 2020-08-19 ENCOUNTER — Telehealth: Payer: Self-pay | Admitting: Surgery

## 2020-08-19 ENCOUNTER — Ambulatory Visit: Payer: Medicare PPO | Admitting: Surgery

## 2020-08-19 ENCOUNTER — Encounter: Payer: Self-pay | Admitting: Surgery

## 2020-08-19 VITALS — BP 180/90 | HR 81 | Temp 97.9°F | Ht 72.0 in | Wt 229.4 lb

## 2020-08-19 DIAGNOSIS — K432 Incisional hernia without obstruction or gangrene: Secondary | ICD-10-CM | POA: Diagnosis not present

## 2020-08-19 NOTE — Telephone Encounter (Signed)
Patient has been advised of Pre-Admission date/time, COVID Testing date and Surgery date.  Surgery Date: 08/27/20 Preadmission Testing Date: 08/22/20 (phone 8a-1p) Covid Testing Date: 08/23/20 - patient advised to go to [facility] ([place]) between 8a-1p   Patient has been made aware to call [phone], between 1-3:00pm the day before surgery, to find out what time to arrive for surgery.

## 2020-08-19 NOTE — Progress Notes (Signed)
08/19/2020  Reason for Visit:  Umbilical hernia  Referring Provider:  Deanna Jones, MD  History of Present Illness: Richard Stafford is a 75 y.o. male presenting for evaluation of an umbilical hernia.  The patient first noticed this last week while doing work on his yard.  He felt something bulging associated with pain.  Now the pain is more dull discomfort, but still there.  He notices that when he strains for bowel movement the area bulges more and feels more uncomfortable, and he has to push more forcefully on it to keep it in.  It reduces on its own when he lies flat.  Reports some constipation issues and is taking stool softeners, which are helping a bit.  Denies any fevers, chills, chest pain, shortness of breath.  He's very active around the house and he and his wife travel a lot.  In fact, he's worried because they'll be traveling towards the end of next month and he would like to address his hernia sooner rather than later.  Of note, he reports that about 20 years ago, he had a laparoscopic right inguinal hernia repair, and had an umbilical incision as part of the surgery.  Past Medical History: Past Medical History:  Diagnosis Date  . History of colon polyps   . Hyperlipidemia      Past Surgical History: Past Surgical History:  Procedure Laterality Date  . COLONOSCOPY  2014   cleared for 5 years- [facility] docs  . COLONOSCOPY WITH PROPOFOL N/A 04/11/2019   Procedure: COLONOSCOPY WITH PROPOFOL;  Surgeon: Skulskie, Martin U, MD;  Location: [facility] ENDOSCOPY;  Service: Endoscopy;  Laterality: N/A;  . CYST EXCISION     cyst in maxillary sinus removed  . LAPAROSCOPIC INGUINAL HERNIA REPAIR Right   . NASAL ENDOSCOPY Bilateral 01/07/2018  . TOOTH EXTRACTION    . varicose veins      Home Medications: Prior to Admission medications   Medication Sig Start Date End Date Taking? Authorizing Provider  Ascorbic Acid (VITAMIN C) 500 MG CAPS Take 1 capsule by mouth daily.   Yes [provider]  aspirin 81 MG tablet Take 1 tablet by mouth daily.   Yes [provider]  finasteride (PROSCAR) 5 MG tablet Take 5 mg by mouth daily. Urology/ [facility] 07/08/16  Yes [provider]  Glucosamine 500 MG CAPS Take 2 capsules by mouth once.    Yes [provider]  Iron, Ferrous Sulfate, 325 (65 Fe) MG TABS Take 1 tablet by mouth daily. 04/10/20  Yes Jones, Deanna C, MD  Multiple Vitamins-Minerals (MENS MULTIVITAMIN PLUS PO) Take 1 tablet by mouth daily.   Yes [provider]  Omega-3 Fatty Acids (FISH OIL) 1000 MG CAPS Take 1 capsule (1,000 mg total) by mouth daily. 03/29/15  Yes Jones, Deanna C, MD  sildenafil (REVATIO) 20 MG tablet Take 20 mg by mouth as needed. Urology/ [facility] 10/23/16  Yes [provider]  zinc gluconate 50 MG tablet Take 50 mg by mouth daily.   Yes [provider]    Allergies: No Known Allergies  Social History:  reports that he quit smoking about 34 years ago. His smoking use included cigarettes. He has a 4.00 pack-year smoking history. He has never used smokeless tobacco. He reports current alcohol use of about 13.0 standard drinks of alcohol per week. He reports that he does not use drugs.   Family History: Family History  Problem Relation Age of Onset  . Cancer Father   .   Heart disease Father   . Cancer Brother   . Cataracts Mother     Review of Systems: Review of Systems  Constitutional: Negative for chills and fever.  HENT: Negative for hearing loss.   Respiratory: Negative for shortness of breath.   Cardiovascular: Negative for chest pain.  Gastrointestinal: Positive for abdominal pain and constipation. Negative for diarrhea, nausea and vomiting.  Genitourinary: Negative for dysuria.  Musculoskeletal: Negative for myalgias.  Skin: Negative for rash.  Neurological: Negative for dizziness.  Psychiatric/Behavioral: Negative for depression.    Physical Exam BP (!) 180/90   Pulse 81   Temp 97.9 F (36.6  C)   Ht 6' (1.829 m)   Wt 229 lb 6.4 oz (104.1 kg)   SpO2 97%   BMI 31.11 kg/m  CONSTITUTIONAL: No acute distress HEENT:  Normocephalic, atraumatic, extraocular motion intact. NECK: Trachea is midline, and there is no jugular venous distension.  RESPIRATORY:  Normal respiratory effort without pathologic use of accessory muscles. CARDIOVASCULAR: Regular rhythm and rate. GI: The abdomen is soft, non-distended, non-tender to palpation.  Patient has a small 1 cm umbilical hernia defect.  Infraumbilical skin incision is well healed otherwise. MUSCULOSKELETAL:  Normal muscle strength and tone in all four extremities.  No peripheral edema or cyanosis. SKIN: Skin turgor is normal. There are no pathologic skin lesions.  NEUROLOGIC:  Motor and sensation is grossly normal.  Cranial nerves are grossly intact. PSYCH:  Alert and oriented to person, place and time. Affect is normal.  Laboratory Analysis: No results found for this or any previous visit (from the past 24 hour(s)).  Imaging: No results found.  Assessment and Plan: This is a 75 y.o. male with an umbilical incisional hernia.  --Discussed with the patient that the natural history of a hernia unfortunately will get larger with time.  There is no conservative measure to try to make the hernia smaller or get rid of it.  He is worried that with all his traveling coming up, that there could be a complication from the hernia when he's out of the country, and he would rather not have to worry about another country or island's health system and possible issues.  He would like his hernia repaired. --Based on the size, I think we can proceed with an open umbilical incisional hernia repair, with mesh to reinforce given that this is an incisional hernia most likely.  Discussed with him the surgery at length, including risks of bleeding, infection, injury to surrounding structures, post-op activity restrictions and recovery time, and he's willing to  proceed. --Will schedule him for 08/27/20.  Advised him that he'd need to stop his Aspirin for surgery, with last dose on 08/21/20.  Will also send medical clearance to Dr. Ronnald Ramp for surgery.  He also understands that he would need a COVID test prior to surgery.  Face-to-face time spent with the patient and care providers was 60 minutes, with more than 50% of the time spent counseling, educating, and coordinating care of the patient.     Melvyn Neth, [facility]

## 2020-08-19 NOTE — Patient Instructions (Addendum)
You have requested for your Umbilical Hernia be repaired. This at [facility] with Dr Hampton Abbot.  You will need to stop your Aspirin 5 days prior to surgery.  We will obtain clearance from Dr Ronnald Ramp for surgery.   Please see your (blue)pre-care sheet for information. Our surgery scheduler will call you to look at surgery dates and go over information.   You will need to arrange to be off work for 1-2 weeks but will have to have a lifting restriction of no more than 15 lbs for 6 weeks following your surgery.   Umbilical Hernia, Adult A hernia is a bulge of tissue that pushes through an opening between muscles. An umbilical hernia happens in the abdomen, near the belly button (umbilicus). The hernia may contain tissues from the small intestine, large intestine, or fatty tissue covering the intestines (omentum). Umbilical hernias in adults tend to get worse over time, and they require surgical treatment. There are several types of umbilical hernias. You may have:  A hernia located just above or below the umbilicus (indirect hernia). This is the most common type of umbilical hernia in adults.  A hernia that forms through an opening formed by the umbilicus (direct hernia).  A hernia that comes and goes (reducible hernia). A reducible hernia may be visible only when you strain, lift something heavy, or cough. This type of hernia can be pushed back into the abdomen (reduced).  A hernia that traps abdominal tissue inside the hernia (incarcerated hernia). This type of hernia cannot be reduced.  A hernia that cuts off blood flow to the tissues inside the hernia (strangulated hernia). The tissues can start to die if this happens. This type of hernia requires emergency treatment.  What are the causes? An umbilical hernia happens when tissue inside the abdomen presses on a weak area of the abdominal muscles. What increases the risk? You may have a greater risk of this condition if  you:  Are obese.  Have had several pregnancies.  Have a buildup of fluid inside your abdomen (ascites).  Have had surgery that weakens the abdominal muscles.  What are the signs or symptoms? The main symptom of this condition is a painless bulge at or near the belly button. A reducible hernia may be visible only when you strain, lift something heavy, or cough. Other symptoms may include:  Dull pain.  A feeling of pressure.  Symptoms of a strangulated hernia may include:  Pain that gets increasingly worse.  Nausea and vomiting.  Pain when pressing on the hernia.  Skin over the hernia becoming red or purple.  Constipation.  Blood in the stool.  How is this diagnosed? This condition may be diagnosed based on:  A physical exam. You may be asked to cough or strain while standing. These actions increase the pressure inside your abdomen and force the hernia through the opening in your muscles. Your health care provider may try to reduce the hernia by pressing on it.  Your symptoms and medical history.  How is this treated? Surgery is the only treatment for an umbilical hernia. Surgery for a strangulated hernia is done as soon as possible. If you have a small hernia that is not incarcerated, you may need to lose weight before having surgery. Follow these instructions at home:  Lose weight, if told by your health care provider.  Do not try to push the hernia back in.  Watch your hernia for any changes in color or size. Tell your  health care provider if any changes occur.  You may need to avoid activities that increase pressure on your hernia.  Do not lift anything that is heavier than 10 lb (4.5 kg) until your health care provider says that this is safe.  Take over-the-counter and prescription medicines only as told by your health care provider.  Keep all follow-up visits as told by your health care provider. This is important. Contact a health care provider if:  Your  hernia gets larger.  Your hernia becomes painful. Get help right away if:  You develop sudden, severe pain near the area of your hernia.  You have pain as well as nausea or vomiting.  You have pain and the skin over your hernia changes color.  You develop a fever. This information is not intended to replace advice given to you by your health care provider. Make sure you discuss any questions you have with your health care provider. Document Released: 12/27/2015 Document Revised: 03/29/2016 Document Reviewed: 12/27/2015 Elsevier Interactive Patient Education  [employer].

## 2020-08-19 NOTE — H&P (View-Only) (Signed)
08/19/2020  Reason for Visit:  Umbilical hernia  Referring Provider:  Otilio Miu, MD  History of Present Illness: Richard Stafford is a 75 y.o. male presenting for evaluation of an umbilical hernia.  The patient first noticed this last week while doing work on his yard.  He felt something bulging associated with pain.  Now the pain is more dull discomfort, but still there.  He notices that when he strains for bowel movement the area bulges more and feels more uncomfortable, and he has to push more forcefully on it to keep it in.  It reduces on its own when he lies flat.  Reports some constipation issues and is taking stool softeners, which are helping a bit.  Denies any fevers, chills, chest pain, shortness of breath.  He's very active around the house and he and his wife travel a lot.  In fact, he's worried because they'll be traveling towards the end of next month and he would like to address his hernia sooner rather than later.  Of note, he reports that about 20 years ago, he had a laparoscopic right inguinal hernia repair, and had an umbilical incision as part of the surgery.  Past Medical History: Past Medical History:  Diagnosis Date  . History of colon polyps   . Hyperlipidemia      Past Surgical History: Past Surgical History:  Procedure Laterality Date  . COLONOSCOPY  2014   cleared for 5 years- [facility] docs  . COLONOSCOPY WITH PROPOFOL N/A 04/11/2019   Procedure: COLONOSCOPY WITH PROPOFOL;  Surgeon: Lollie Sails, MD;  Location: [facility] ENDOSCOPY;  Service: Endoscopy;  Laterality: N/A;  . CYST EXCISION     cyst in maxillary sinus removed  . LAPAROSCOPIC INGUINAL HERNIA REPAIR Right   . NASAL ENDOSCOPY Bilateral 01/07/2018  . TOOTH EXTRACTION    . varicose veins      Home Medications: Prior to Admission medications   Medication Sig Start Date End Date Taking? Authorizing Provider  Ascorbic Acid (VITAMIN C) 500 MG CAPS Take 1 capsule by mouth daily.   Yes [provider]  aspirin 81 MG tablet Take 1 tablet by mouth daily.   Yes [provider]  finasteride (PROSCAR) 5 MG tablet Take 5 mg by mouth daily. Urology/ [facility] 07/08/16  Yes [provider]  Glucosamine 500 MG CAPS Take 2 capsules by mouth once.    Yes [provider]  Iron, Ferrous Sulfate, 325 (65 Fe) MG TABS Take 1 tablet by mouth daily. 04/10/20  Yes Juline Patch, MD  Multiple Vitamins-Minerals (MENS MULTIVITAMIN PLUS PO) Take 1 tablet by mouth daily.   Yes [provider]  Omega-3 Fatty Acids (FISH OIL) 1000 MG CAPS Take 1 capsule (1,000 mg total) by mouth daily. 03/29/15  Yes Juline Patch, MD  sildenafil (REVATIO) 20 MG tablet Take 20 mg by mouth as needed. Urology/ [facility] 10/23/16  Yes [provider]  zinc gluconate 50 MG tablet Take 50 mg by mouth daily.   Yes [provider]    Allergies: No Known Allergies  Social History:  reports that he quit smoking about 34 years ago. His smoking use included cigarettes. He has a 4.00 pack-year smoking history. He has never used smokeless tobacco. He reports current alcohol use of about 13.0 standard drinks of alcohol per week. He reports that he does not use drugs.   Family History: Family History  Problem Relation Age of Onset  . Cancer Father   .  Heart disease Father   . Cancer Brother   . Cataracts Mother     Review of Systems: Review of Systems  Constitutional: Negative for chills and fever.  HENT: Negative for hearing loss.   Respiratory: Negative for shortness of breath.   Cardiovascular: Negative for chest pain.  Gastrointestinal: Positive for abdominal pain and constipation. Negative for diarrhea, nausea and vomiting.  Genitourinary: Negative for dysuria.  Musculoskeletal: Negative for myalgias.  Skin: Negative for rash.  Neurological: Negative for dizziness.  Psychiatric/Behavioral: Negative for depression.    Physical Exam BP (!) 180/90   Pulse 81   Temp 97.9 F (36.6  C)   Ht 6' (1.829 m)   Wt 229 lb 6.4 oz (104.1 kg)   SpO2 97%   BMI 31.11 kg/m  CONSTITUTIONAL: No acute distress HEENT:  Normocephalic, atraumatic, extraocular motion intact. NECK: Trachea is midline, and there is no jugular venous distension.  RESPIRATORY:  Normal respiratory effort without pathologic use of accessory muscles. CARDIOVASCULAR: Regular rhythm and rate. GI: The abdomen is soft, non-distended, non-tender to palpation.  Patient has a small 1 cm umbilical hernia defect.  Infraumbilical skin incision is well healed otherwise. MUSCULOSKELETAL:  Normal muscle strength and tone in all four extremities.  No peripheral edema or cyanosis. SKIN: Skin turgor is normal. There are no pathologic skin lesions.  NEUROLOGIC:  Motor and sensation is grossly normal.  Cranial nerves are grossly intact. PSYCH:  Alert and oriented to person, place and time. Affect is normal.  Laboratory Analysis: No results found for this or any previous visit (from the past 24 hour(s)).  Imaging: No results found.  Assessment and Plan: This is a 75 y.o. male with an umbilical incisional hernia.  --Discussed with the patient that the natural history of a hernia unfortunately will get larger with time.  There is no conservative measure to try to make the hernia smaller or get rid of it.  He is worried that with all his traveling coming up, that there could be a complication from the hernia when he's out of the country, and he would rather not have to worry about another country or island's health system and possible issues.  He would like his hernia repaired. --Based on the size, I think we can proceed with an open umbilical incisional hernia repair, with mesh to reinforce given that this is an incisional hernia most likely.  Discussed with him the surgery at length, including risks of bleeding, infection, injury to surrounding structures, post-op activity restrictions and recovery time, and he's willing to  proceed. --Will schedule him for 08/27/20.  Advised him that he'd need to stop his Aspirin for surgery, with last dose on 08/21/20.  Will also send medical clearance to Dr. Ronnald Ramp for surgery.  He also understands that he would need a COVID test prior to surgery.  Face-to-face time spent with the patient and care providers was 60 minutes, with more than 50% of the time spent counseling, educating, and coordinating care of the patient.     Melvyn Neth, [facility]

## 2020-08-19 NOTE — Progress Notes (Signed)
Medical Clearance request has been faxed to Dr Jake Seats office.

## 2020-08-20 NOTE — Progress Notes (Signed)
Medical Clearance has been received from Dr Adah Salvage office. The patient is cleared at low risk for surgery. Okay to hold aspirin 5 days prior to surgery.

## 2020-08-22 ENCOUNTER — Other Ambulatory Visit: Payer: Self-pay

## 2020-08-22 ENCOUNTER — Other Ambulatory Visit
Admission: RE | Admit: 2020-08-22 | Discharge: 2020-08-22 | Disposition: A | Discharge status: Home / Self Care | Payer: Medicare PPO | Source: Ambulatory Visit | Attending: Surgery | Admitting: Surgery

## 2020-08-22 DIAGNOSIS — Z01812 Encounter for preprocedural laboratory examination: Secondary | ICD-10-CM | POA: Insufficient documentation

## 2020-08-22 DIAGNOSIS — Z20822 Contact with and (suspected) exposure to covid-19: Secondary | ICD-10-CM | POA: Insufficient documentation

## 2020-08-22 HISTORY — DX: Gastro-esophageal reflux disease without esophagitis: K21.9

## 2020-08-22 NOTE — Patient Instructions (Addendum)
Your procedure is scheduled on: Tuesday, January 18 Report to the Registration Desk on the 1st floor of the [facility]. To find out your arrival time, please call [phone] between 1PM - 3PM on: Monday, January 17  REMEMBER: Instructions that are not followed completely may result in serious medical risk, up to and including death; or upon the discretion of your surgeon and anesthesiologist your surgery may need to be rescheduled.  Do not eat food after midnight the night before surgery.  No gum chewing, lozengers or hard candies.  You may however, drink CLEAR liquids up to 2 hours before you are scheduled to arrive for your surgery. Do not drink anything within 2 hours of your scheduled arrival time.  Clear liquids include: - water  - apple juice without pulp - gatorade (not RED, PURPLE, OR BLUE) - black coffee or tea (Do NOT add milk or creamers to the coffee or tea) Do NOT drink anything that is not on this list.  TAKE THESE MEDICATIONS THE MORNING OF SURGERY WITH A SIP OF WATER:  1.  finasteride  Follow recommendations from Cardiologist, Pulmonologist or PCP regarding stopping Aspirin. Last day to take is January 12  One week prior to surgery: starting January 11 Stop Anti-inflammatories (NSAIDS) such as Advil, Aleve, Ibuprofen, Motrin, Naproxen, Naprosyn and Aspirin based products such as Excedrin, Goodys Powder, BC Powder. Stop ANY OVER THE COUNTER supplements until after surgery. (vitamin C, glucosamine, fish oil, zinc) (However, you may continue taking multivitamin up until the day before surgery.)  No Alcohol for 24 hours before or after surgery.  No Smoking including e-cigarettes for 24 hours prior to surgery.  No chewable tobacco products for at least 6 hours prior to surgery.  No nicotine patches on the day of surgery.  Do not use any "recreational" drugs for at least a week prior to your surgery.  Please be advised that the combination of cocaine and  anesthesia may have negative outcomes, up to and including death. If you test positive for cocaine, your surgery will be cancelled.  On the morning of surgery brush your teeth with toothpaste and water, you may rinse your mouth with mouthwash if you wish. Do not swallow any toothpaste or mouthwash.  Do not wear jewelry, make-up, hairpins, clips or nail polish.  Do not wear lotions, powders, or perfumes.   Do not shave body from the neck down 48 hours prior to surgery just in case you cut yourself which could leave a site for infection.  Also, freshly shaved skin may become irritated if using the CHG soap.  Contact lenses, hearing aids and dentures may not be worn into surgery.  Do not bring valuables to the hospital. [facility] is not responsible for any missing/lost belongings or valuables.   Use CHG Soap as directed on instruction sheet.  Notify your doctor if there is any change in your medical condition (cold, fever, infection).  Wear comfortable clothing (specific to your surgery type) to the hospital.  Plan for stool softeners for home use; pain medications have a tendency to cause constipation. You can also help prevent constipation by eating foods high in fiber such as fruits and vegetables and drinking plenty of fluids as your diet allows.  After surgery, you can help prevent lung complications by doing breathing exercises.  Take deep breaths and cough every 1-2 hours. Your doctor may order a device called an Incentive Spirometer to help you take deep breaths. When coughing or sneezing, hold a  pillow firmly against your incision with both hands. This is called "splinting." Doing this helps protect your incision. It also decreases belly discomfort.  If you are being discharged the day of surgery, you will not be allowed to drive home. You will need a responsible adult (18 years or older) to drive you home and stay with you that night.   If you are taking public  transportation, you will need to have a responsible adult (18 years or older) with you. Please confirm with your physician that it is acceptable to use public transportation.   Please call the [place] Dept. at [phone] if you have any questions about these instructions.  Visitation Policy:  Patients undergoing a surgery or procedure may have one family member or support person with them as long as that person is not COVID-19 positive or experiencing its symptoms.  That person may remain in the waiting area during the procedure.

## 2020-08-23 ENCOUNTER — Other Ambulatory Visit
Admission: RE | Admit: 2020-08-23 | Discharge: 2020-08-23 | Disposition: A | Discharge status: Home / Self Care | Payer: Medicare PPO | Source: Ambulatory Visit | Attending: Surgery | Admitting: Surgery

## 2020-08-23 DIAGNOSIS — Z20822 Contact with and (suspected) exposure to covid-19: Secondary | ICD-10-CM | POA: Diagnosis not present

## 2020-08-23 DIAGNOSIS — Z01812 Encounter for preprocedural laboratory examination: Secondary | ICD-10-CM | POA: Diagnosis not present

## 2020-08-23 LAB — SARS CORONAVIRUS 2 (TAT 6-24 HRS): SARS Coronavirus 2: NEGATIVE

## 2020-08-27 ENCOUNTER — Encounter
Admission: RE | Disposition: A | Discharge status: Home / Self Care | Payer: Self-pay | Source: Home / Self Care | Attending: Surgery

## 2020-08-27 ENCOUNTER — Ambulatory Visit: Payer: Medicare PPO | Admitting: Anesthesiology

## 2020-08-27 ENCOUNTER — Encounter: Payer: Self-pay | Admitting: Surgery

## 2020-08-27 ENCOUNTER — Ambulatory Visit
Admission: RE | Admit: 2020-08-27 | Discharge: 2020-08-27 | Disposition: A | Discharge status: Home / Self Care | Payer: Medicare PPO | Attending: Surgery | Admitting: Surgery

## 2020-08-27 ENCOUNTER — Other Ambulatory Visit: Payer: Self-pay

## 2020-08-27 DIAGNOSIS — K432 Incisional hernia without obstruction or gangrene: Secondary | ICD-10-CM | POA: Insufficient documentation

## 2020-08-27 DIAGNOSIS — E785 Hyperlipidemia, unspecified: Secondary | ICD-10-CM | POA: Diagnosis not present

## 2020-08-27 DIAGNOSIS — K219 Gastro-esophageal reflux disease without esophagitis: Secondary | ICD-10-CM | POA: Diagnosis not present

## 2020-08-27 DIAGNOSIS — Z79899 Other long term (current) drug therapy: Secondary | ICD-10-CM | POA: Insufficient documentation

## 2020-08-27 DIAGNOSIS — K429 Umbilical hernia without obstruction or gangrene: Secondary | ICD-10-CM | POA: Diagnosis not present

## 2020-08-27 DIAGNOSIS — Z87891 Personal history of nicotine dependence: Secondary | ICD-10-CM | POA: Diagnosis not present

## 2020-08-27 HISTORY — PX: INCISIONAL HERNIA REPAIR: SHX193

## 2020-08-27 SURGERY — REPAIR, HERNIA, INCISIONAL
Anesthesia: General

## 2020-08-27 MED ORDER — CHLORHEXIDINE GLUCONATE CLOTH 2 % EX PADS
6.0000 | MEDICATED_PAD | Freq: Once | CUTANEOUS | Status: AC
  Administered 2020-08-27: 6 via TOPICAL

## 2020-08-27 MED ORDER — ORAL CARE MOUTH RINSE: 15.0000 mL | Freq: Once | OROMUCOSAL | Status: AC

## 2020-08-27 MED ORDER — IBUPROFEN 600 MG PO TABS: 600.0000 mg | ORAL_TABLET | Freq: Three times a day (TID) | ORAL | 1 refills | Status: DC | PRN

## 2020-08-27 MED ORDER — ONDANSETRON HCL 4 MG/2ML IJ SOLN: 4.0000 mg | Freq: Once | INTRAMUSCULAR | Status: DC | PRN

## 2020-08-27 MED ORDER — LACTATED RINGERS IV SOLN: INTRAVENOUS | Status: DC

## 2020-08-27 MED ORDER — ACETAMINOPHEN 500 MG PO TABS: 1000.0000 mg | ORAL_TABLET | Freq: Four times a day (QID) | ORAL | 2 refills | Status: DC | PRN

## 2020-08-27 MED ORDER — MIDAZOLAM HCL 2 MG/2ML IJ SOLN
INTRAMUSCULAR | Status: AC
  Filled 2020-08-27: qty 2

## 2020-08-27 MED ORDER — EPHEDRINE SULFATE 50 MG/ML IJ SOLN
INTRAMUSCULAR | Status: DC | PRN
  Administered 2020-08-27: 10 mg via INTRAVENOUS

## 2020-08-27 MED ORDER — CHLORHEXIDINE GLUCONATE 0.12 % MT SOLN
OROMUCOSAL | Status: AC
  Filled 2020-08-27: qty 15

## 2020-08-27 MED ORDER — SUGAMMADEX SODIUM 200 MG/2ML IV SOLN
INTRAVENOUS | Status: DC | PRN
  Administered 2020-08-27: 200 mg via INTRAVENOUS

## 2020-08-27 MED ORDER — BUPIVACAINE-EPINEPHRINE (PF) 0.25% -1:200000 IJ SOLN
INTRAMUSCULAR | Status: DC | PRN
  Administered 2020-08-27: 30 mL

## 2020-08-27 MED ORDER — FAMOTIDINE 20 MG PO TABS
ORAL_TABLET | ORAL | Status: AC
  Filled 2020-08-27: qty 1

## 2020-08-27 MED ORDER — CEFAZOLIN SODIUM-DEXTROSE 2-4 GM/100ML-% IV SOLN
INTRAVENOUS | Status: AC
  Filled 2020-08-27: qty 100

## 2020-08-27 MED ORDER — FAMOTIDINE 20 MG PO TABS
20.0000 mg | ORAL_TABLET | Freq: Once | ORAL | Status: AC
  Administered 2020-08-27: 20 mg via ORAL

## 2020-08-27 MED ORDER — LIDOCAINE HCL (CARDIAC) PF 100 MG/5ML IV SOSY
PREFILLED_SYRINGE | INTRAVENOUS | Status: DC | PRN
  Administered 2020-08-27: 100 mg via INTRAVENOUS

## 2020-08-27 MED ORDER — FENTANYL CITRATE (PF) 100 MCG/2ML IJ SOLN: 25.0000 ug | INTRAMUSCULAR | Status: DC | PRN

## 2020-08-27 MED ORDER — ACETAMINOPHEN 500 MG PO TABS
ORAL_TABLET | ORAL | Status: AC
  Filled 2020-08-27: qty 2

## 2020-08-27 MED ORDER — PROPOFOL 10 MG/ML IV BOLUS
INTRAVENOUS | Status: AC
  Filled 2020-08-27: qty 40

## 2020-08-27 MED ORDER — OXYCODONE HCL 5 MG PO TABS: 5.0000 mg | ORAL_TABLET | ORAL | 0 refills | Status: DC | PRN

## 2020-08-27 MED ORDER — FENTANYL CITRATE (PF) 100 MCG/2ML IJ SOLN
INTRAMUSCULAR | Status: AC
  Filled 2020-08-27: qty 2

## 2020-08-27 MED ORDER — ROCURONIUM BROMIDE 100 MG/10ML IV SOLN
INTRAVENOUS | Status: DC | PRN
  Administered 2020-08-27: 50 mg via INTRAVENOUS

## 2020-08-27 MED ORDER — ACETAMINOPHEN 500 MG PO TABS
1000.0000 mg | ORAL_TABLET | ORAL | Status: AC
  Administered 2020-08-27: 1000 mg via ORAL

## 2020-08-27 MED ORDER — CEFAZOLIN SODIUM-DEXTROSE 2-4 GM/100ML-% IV SOLN
2.0000 g | INTRAVENOUS | Status: AC
  Administered 2020-08-27: 2 g via INTRAVENOUS

## 2020-08-27 MED ORDER — FENTANYL CITRATE (PF) 100 MCG/2ML IJ SOLN
INTRAMUSCULAR | Status: DC | PRN
  Administered 2020-08-27 (×2): 50 ug via INTRAVENOUS

## 2020-08-27 MED ORDER — BUPIVACAINE-EPINEPHRINE (PF) 0.25% -1:200000 IJ SOLN
INTRAMUSCULAR | Status: AC
  Filled 2020-08-27: qty 30

## 2020-08-27 MED ORDER — PROPOFOL 10 MG/ML IV BOLUS
INTRAVENOUS | Status: DC | PRN
  Administered 2020-08-27: 170 mg via INTRAVENOUS

## 2020-08-27 MED ORDER — KETOROLAC TROMETHAMINE 30 MG/ML IJ SOLN
INTRAMUSCULAR | Status: DC | PRN
  Administered 2020-08-27: 30 mg via INTRAVENOUS

## 2020-08-27 MED ORDER — ONDANSETRON HCL 4 MG/2ML IJ SOLN
INTRAMUSCULAR | Status: DC | PRN
  Administered 2020-08-27: 4 mg via INTRAVENOUS

## 2020-08-27 MED ORDER — CHLORHEXIDINE GLUCONATE 0.12 % MT SOLN
15.0000 mL | Freq: Once | OROMUCOSAL | Status: AC
  Administered 2020-08-27: 15 mL via OROMUCOSAL

## 2020-08-27 MED ORDER — MIDAZOLAM HCL 2 MG/2ML IJ SOLN
INTRAMUSCULAR | Status: DC | PRN
  Administered 2020-08-27: 2 mg via INTRAVENOUS

## 2020-08-27 MED ORDER — GABAPENTIN 100 MG PO CAPS
200.0000 mg | ORAL_CAPSULE | ORAL | Status: AC
  Administered 2020-08-27: 200 mg via ORAL

## 2020-08-27 MED ORDER — DEXAMETHASONE SODIUM PHOSPHATE 10 MG/ML IJ SOLN
INTRAMUSCULAR | Status: DC | PRN
  Administered 2020-08-27: 10 mg via INTRAVENOUS

## 2020-08-27 SURGICAL SUPPLY — 29 items
CANISTER SUCT 1200ML W/VALVE (MISCELLANEOUS) IMPLANT
CHLORAPREP W/TINT 26 (MISCELLANEOUS) ×2 IMPLANT
COVER WAND RF STERILE (DRAPES) IMPLANT
DERMABOND ADVANCED (GAUZE/BANDAGES/DRESSINGS) ×1
DERMABOND ADVANCED .7 DNX12 (GAUZE/BANDAGES/DRESSINGS) ×1 IMPLANT
DRAPE LAPAROTOMY 100X77 ABD (DRAPES) ×2 IMPLANT
ELECT CAUTERY BLADE 6.4 (BLADE) ×2 IMPLANT
ELECT REM PT RETURN 9FT ADLT (ELECTROSURGICAL) ×2
ELECTRODE REM PT RTRN 9FT ADLT (ELECTROSURGICAL) ×1 IMPLANT
GLOVE PROTEXIS LATEX SZ 7.5 (GLOVE) ×2 IMPLANT
GLOVE SURG SYN 7.0 (GLOVE) ×4 IMPLANT
GOWN STRL REUS W/ TWL LRG LVL3 (GOWN DISPOSABLE) ×2 IMPLANT
GOWN STRL REUS W/TWL LRG LVL3 (GOWN DISPOSABLE) ×2
MANIFOLD NEPTUNE II (INSTRUMENTS) ×2 IMPLANT
MESH VENTRALEX ST 2.5 CRC MED (Mesh General) ×2 IMPLANT
NEEDLE HYPO 22GX1.5 SAFETY (NEEDLE) ×2 IMPLANT
NS IRRIG 500ML POUR BTL (IV SOLUTION) ×2 IMPLANT
PACK BASIN MINOR ARMC (MISCELLANEOUS) ×2 IMPLANT
SUT ETHIBOND 0 (SUTURE) ×2 IMPLANT
SUT ETHIBOND 0 MO6 C/R (SUTURE) IMPLANT
SUT MNCRL AB 4-0 PS2 18 (SUTURE) ×2 IMPLANT
SUT PROLENE 2 0 SH DA (SUTURE) ×2 IMPLANT
SUT SILK 0 (SUTURE)
SUT SILK 0 30XBRD TIE 6 (SUTURE) IMPLANT
SUT SILK 0 SH 30 (SUTURE) IMPLANT
SUT VIC AB 0 SH 27 (SUTURE) ×2 IMPLANT
SUT VIC AB 3-0 SH 27 (SUTURE) ×1
SUT VIC AB 3-0 SH 27X BRD (SUTURE) ×1 IMPLANT
SYR 10ML LL (SYRINGE) ×2 IMPLANT

## 2020-08-27 NOTE — Interval H&P Note (Signed)
History and Physical Interval Note:  08/27/2020 7:06 AM  Richard Stafford  has presented today for surgery, with the diagnosis of umbilical incisional hernia.  The various methods of treatment have been discussed with the patient and family. After consideration of risks, benefits and other options for treatment, the patient has consented to  Procedure(s): HERNIA REPAIR INCISIONAL (N/A) as a surgical intervention.  The patient's history has been reviewed, patient examined, no change in status, stable for surgery.  I have reviewed the patient's chart and labs.  Questions were answered to the patient's satisfaction.     Hillary Schwegler

## 2020-08-27 NOTE — Op Note (Signed)
Procedure Date:  08/27/2020  Pre-operative Diagnosis:  Incisional hernia  Post-operative Diagnosis: Incisional hernia  Procedure:  Incisional hernia repair with mesh  Surgeon:  Melvyn Neth, MD  Assistant:  Jorge Ny, PA-S  Anesthesia:  General endotracheal  Estimated Blood Loss:  5 ml  Specimens:  None  Complications:  None  Indications for Procedure:  This is a 75 y.o. male who presents with an incisional umbilical hernia.  He had prior laparoscopic inguinal hernia repair, with an umbilical incision for that surgery.  The risks of bleeding, abscess or infection, injury to surrounding structures, and need for further procedures were all discussed with the patient and was willing to proceed.  Description of Procedure: The patient was correctly identified in the preoperative area and brought into the operating room.  The patient was placed supine with VTE prophylaxis in place.  Appropriate time-outs were performed.  Anesthesia was induced and the patient was intubated.  Appropriate antibiotics were infused.  The abdomen was prepped and draped in a sterile fashion.  An infraumbilical incision was made and cautery was used to dissect down the subcutaneous tissue along the umbilical stalk.  Kelly forceps were used to dissect the umbilical stalk and it was divided at the fascia using cautery.  This allowed visualization of the hernia defect.  The hernia was reduced without complications.  The fascial edges were cleaned using cautery.  There was a small fascial injury during this process, that measured about 5 mm.  This was repaired primarily with 0 Ethibond sutures.  A 6.4 Ventralex ST mesh was introduced via the defect and spread open.  The tails were secured to the fascia using 2-0 Prolene sutures.  The fascia was then closed with 0 Ethibond sutures, incorporating a layer of the mesh with each suture.  The umbilical stalk was then reattached to the fascia using 2-0 Vicryl. The wound was  irrigated and local anesthetic was infused.  The wound was then closed in layers using 3-0 Vicryl and 4-0 Monocryl. The incision was cleaned and sealed with DermaBond.  The patient was emerged from anesthesia and extubated and brought to the recovery room for further management.  The patient tolerated the procedure well and all counts were correct at the end of the case.   Melvyn Neth, MD

## 2020-08-27 NOTE — Anesthesia Postprocedure Evaluation (Signed)
Anesthesia Post Note  Patient: CARROL BONDAR  Procedure(s) Performed: HERNIA REPAIR INCISIONAL (N/A )  Patient location during evaluation: PACU Anesthesia Type: General Level of consciousness: awake and alert Pain management: pain level controlled Vital Signs Assessment: post-procedure vital signs reviewed and stable Respiratory status: spontaneous breathing, nonlabored ventilation, respiratory function stable and patient connected to face mask oxygen Cardiovascular status: blood pressure returned to baseline and stable Postop Assessment: no apparent nausea or vomiting Anesthetic complications: no   No complications documented.   Last Vitals:  Vitals:   08/27/20 0623 08/27/20 0902  BP: (!) 159/85 (!) 155/88  Pulse: (!) 55 84  Resp: 16 17  Temp: 36.5 C (!) 36.2 C  SpO2: 99% 100%    Last Pain:  Vitals:   08/27/20 0623  TempSrc: Oral  PainSc: 0-No pain                 Emmalyne Giacomo

## 2020-08-27 NOTE — Anesthesia Preprocedure Evaluation (Signed)
Anesthesia Evaluation  Patient identified by MRN, date of birth, ID band Patient awake    Reviewed: Allergy & Precautions, NPO status , Patient's Chart, lab work & pertinent test results  History of Anesthesia Complications Negative for: history of anesthetic complications  Airway Mallampati: III       Dental   Pulmonary neg sleep apnea, neg COPD, Not current smoker, former smoker,           Cardiovascular hypertension, Pt. on medications (-) Past MI and (-) CHF (-) dysrhythmias (-) Valvular Problems/Murmurs     Neuro/Psych neg Seizures    GI/Hepatic Neg liver ROS, GERD  Medicated,  Endo/Other  neg diabetes  Renal/GU negative Renal ROS     Musculoskeletal   Abdominal   Peds  Hematology   Anesthesia Other Findings   Reproductive/Obstetrics                             Anesthesia Physical Anesthesia Plan  ASA: III  Anesthesia Plan: General   Post-op Pain Management:    Induction: Intravenous  PONV Risk Score and Plan: 2 and Ondansetron and Dexamethasone  Airway Management Planned: Oral ETT  Additional Equipment:   Intra-op Plan:   Post-operative Plan:   Informed Consent: I have reviewed the patients History and Physical, chart, labs and discussed the procedure including the risks, benefits and alternatives for the proposed anesthesia with the patient or authorized representative who has indicated his/her understanding and acceptance.       Plan Discussed with:   Anesthesia Plan Comments:         Anesthesia Quick Evaluation

## 2020-08-27 NOTE — Anesthesia Procedure Notes (Signed)
Procedure Name: Intubation Performed by: Johnnye Lana, CRNA Pre-anesthesia Checklist: Patient identified, Patient being monitored, Timeout performed, Emergency Drugs available and Suction available Patient Re-evaluated:Patient Re-evaluated prior to induction Oxygen Delivery Method: Circle system utilized Preoxygenation: Pre-oxygenation with 100% oxygen Induction Type: IV induction Ventilation: Mask ventilation without difficulty Laryngoscope Size: 3 and McGraph Grade View: Grade I Tube type: Oral Tube size: 7.5 mm Number of attempts: 1 Airway Equipment and Method: Stylet Placement Confirmation: ETT inserted through vocal cords under direct vision,  positive ETCO2 and breath sounds checked- equal and bilateral Secured at: 26 cm Tube secured with: Tape Dental Injury: Teeth and Oropharynx as per pre-operative assessment

## 2020-08-27 NOTE — Anesthesia Postprocedure Evaluation (Signed)
Anesthesia Post Note  Patient: Richard Stafford  Procedure(s) Performed: HERNIA REPAIR INCISIONAL (N/A )  Patient location during evaluation: PACU Anesthesia Type: General Level of consciousness: awake and alert Pain management: pain level controlled Vital Signs Assessment: post-procedure vital signs reviewed and stable Respiratory status: spontaneous breathing and respiratory function stable Cardiovascular status: stable Anesthetic complications: no   No complications documented.   Last Vitals:  Vitals:   08/27/20 0905 08/27/20 0916  BP:  (!) 157/69  Pulse:  (!) 49  Resp:  12  Temp:    SpO2: 100% 98%    Last Pain:  Vitals:   08/27/20 0902  TempSrc:   PainSc: 0-No pain                 Serenity Batley K

## 2020-08-27 NOTE — Discharge Instructions (Signed)

## 2020-08-27 NOTE — Transfer of Care (Signed)
Immediate Anesthesia Transfer of Care Note  Patient: Richard Stafford  Procedure(s) Performed: HERNIA REPAIR INCISIONAL (N/A )  Patient Location: PACU  Anesthesia Type:General  Level of Consciousness: awake, alert  and oriented  Airway & Oxygen Therapy: Patient Spontanous Breathing and Patient connected to face mask oxygen  Post-op Assessment: Post -op Vital signs reviewed and stable  Post vital signs: stable  Last Vitals:  Vitals Value Taken Time  BP 166/104 08/27/20 0901  Temp    Pulse 71 08/27/20 0903  Resp 14 08/27/20 0903  SpO2 100 % 08/27/20 0903  Vitals shown include unvalidated device data.  Last Pain:  Vitals:   08/27/20 0623  TempSrc: Oral  PainSc: 0-No pain         Complications: No complications documented.

## 2020-08-29 ENCOUNTER — Telehealth: Payer: Self-pay

## 2020-08-29 NOTE — Telephone Encounter (Signed)
Patient called to ask if a little redness is normal-he had a umbilical hernia repair done 08/27/2020-denies bright redness, no fever, no drainage from incision site.  He was instructed to take pain medication if needed and apply ice pack to the site throughout the day to help reduce minium swelling.

## 2020-09-09 ENCOUNTER — Encounter: Payer: Self-pay | Admitting: Surgery

## 2020-09-09 ENCOUNTER — Ambulatory Visit (INDEPENDENT_AMBULATORY_CARE_PROVIDER_SITE_OTHER): Payer: Medicare PPO | Admitting: Surgery

## 2020-09-09 ENCOUNTER — Other Ambulatory Visit: Payer: Self-pay

## 2020-09-09 VITALS — BP 146/73 | HR 61 | Temp 98.3°F | Ht 72.0 in | Wt 223.4 lb

## 2020-09-09 DIAGNOSIS — Z09 Encounter for follow-up examination after completed treatment for conditions other than malignant neoplasm: Secondary | ICD-10-CM

## 2020-09-09 DIAGNOSIS — K432 Incisional hernia without obstruction or gangrene: Secondary | ICD-10-CM

## 2020-09-09 NOTE — Patient Instructions (Addendum)
If you have any concerns or questions, please feel free to call our office. Restrictions include: no lifting over 10 pounds until 10/06/2020.    GENERAL POST-OPERATIVE PATIENT INSTRUCTIONS   WOUND CARE INSTRUCTIONS:  Keep a dry clean dressing on the wound if there is drainage. The initial bandage may be removed after 24 hours.  Once the wound has quit draining you may leave it open to air.  If clothing rubs against the wound or causes irritation and the wound is not draining you may cover it with a dry dressing during the daytime.  Try to keep the wound dry and avoid ointments on the wound unless directed to do so.  If the wound becomes bright red and painful or starts to drain infected material that is not clear, please contact your physician immediately.  If the wound is mildly pink and has a thick firm ridge underneath it, this is normal, and is referred to as a healing ridge.  This will resolve over the next 4-6 weeks.  BATHING: You may shower if you have been informed of this by your surgeon. However, Please do not submerge in a tub, hot tub, or pool until incisions are completely sealed or have been told by your surgeon that you may do so.  DIET:  You may eat any foods that you can tolerate.  It is a good idea to eat a high fiber diet and take in plenty of fluids to prevent constipation.  If you do become constipated you may want to take a mild laxative or take ducolax tablets on a daily basis until your bowel habits are regular.  Constipation can be very uncomfortable, along with straining, after recent surgery.  ACTIVITY:  You are encouraged to cough and deep breath or use your incentive spirometer if you were given one, every 15-30 minutes when awake.  This will help prevent respiratory complications and low grade fevers post-operatively if you had a general anesthetic.  You may want to hug a pillow when coughing and sneezing to add additional support to the surgical area, if you had abdominal  or chest surgery, which will decrease pain during these times.  You are encouraged to walk and engage in light activity for the next two weeks.  You should not lift more than 10 pounds, until 10/06/2020 as it could put you at increased risk for complications.  Twenty pounds is roughly equivalent to a plastic bag of groceries. At that time- Listen to your body when lifting, if you have pain when lifting, stop and then try again in a few days. Soreness after doing exercises or activities of daily living is normal as you get back in to your normal routine.  MEDICATIONS:  Try to take narcotic medications and anti-inflammatory medications, such as tylenol, ibuprofen, naprosyn, etc., with food.  This will minimize stomach upset from the medication.  Should you develop nausea and vomiting from the pain medication, or develop a rash, please discontinue the medication and contact your physician.  You should not drive, make important decisions, or operate machinery when taking narcotic pain medication.  SUNBLOCK Use sun block to incision area over the next year if this area will be exposed to sun. This helps decrease scarring and will allow you avoid a permanent darkened area over your incision.

## 2020-09-09 NOTE — Progress Notes (Signed)
09/09/2020  HPI: Richard Stafford is a 75 y.o. male s/p incisional hernia repair with mesh on 08/27/20.  Patient presents today for follow up.  He's doing very well, without any further pain at the incision.  He does report feeling a bump at the incision area that he wanted to get checked out.  Vital signs: BP (!) 146/73   Pulse 61   Temp 98.3 F (36.8 C) (Oral)   Ht 6' (1.829 m)   Wt 223 lb 6.4 oz (101.3 kg)   SpO2 97%   BMI 30.30 kg/m    Physical Exam: Constitutional: No acute distress Abdomen: soft, non-distended, non-tender.  Umbilical incision is healing well, clean, dry, intact.  No erythema or induration.  Just superior to the incision, there is a small raised area consistent with scar tissue and potentially some fluid.  No evidence of hernia recurrence.  Assessment/Plan: This is a 75 y.o. male s/p open incisional hernia repair with mesh.  --Discussed with patient that the "bump" he's feeling above the incision is scar tissue and some fluid from the surgery.  This will continue to improve and flatten down with time.  No evidence of hernia recurrence. --Patient is otherwise doing well.  He goes on a trip around 10/04/20 with his wife.  By then, he'll be almost at the 6 week mark and he will be ok to lift his luggage and carryon bag. --Follow up as needed.   Melvyn Neth, [facility]

## 2020-10-22 ENCOUNTER — Telehealth: Payer: Self-pay | Admitting: Family Medicine

## 2020-10-22 NOTE — Telephone Encounter (Signed)
Copied from [place] [id number]. Topic: Medicare AWV >> Oct 22, 2020  1:55 PM Gerre Pebbles R wrote: Reason for CRM: Left message for patient to call back and schedule Medicare Annual Wellness Visit (AWV) either virtually or in office. Whichever the patients preference is.  Last AWV 10/21/17; please schedule at anytime with [facility] Health Advisor.  This should be a 40 minute visit.

## 2021-04-09 ENCOUNTER — Other Ambulatory Visit: Payer: Self-pay

## 2021-04-09 ENCOUNTER — Encounter: Payer: Self-pay | Admitting: Family Medicine

## 2021-04-09 ENCOUNTER — Ambulatory Visit (INDEPENDENT_AMBULATORY_CARE_PROVIDER_SITE_OTHER): Payer: Medicare PPO | Admitting: Family Medicine

## 2021-04-09 VITALS — BP 120/62 | HR 60 | Ht 72.0 in | Wt 217.0 lb

## 2021-04-09 DIAGNOSIS — E785 Hyperlipidemia, unspecified: Secondary | ICD-10-CM | POA: Diagnosis not present

## 2021-04-09 DIAGNOSIS — D509 Iron deficiency anemia, unspecified: Secondary | ICD-10-CM

## 2021-04-09 DIAGNOSIS — Z0000 Encounter for general adult medical examination without abnormal findings: Secondary | ICD-10-CM

## 2021-04-09 DIAGNOSIS — R351 Nocturia: Secondary | ICD-10-CM

## 2021-04-09 DIAGNOSIS — R7989 Other specified abnormal findings of blood chemistry: Secondary | ICD-10-CM

## 2021-04-09 DIAGNOSIS — R7402 Elevation of levels of lactic acid dehydrogenase (LDH): Secondary | ICD-10-CM

## 2021-04-09 DIAGNOSIS — R7401 Elevation of levels of liver transaminase levels: Secondary | ICD-10-CM | POA: Diagnosis not present

## 2021-04-09 LAB — HEMOCCULT GUIAC POC 1CARD (OFFICE): Fecal Occult Blood, POC: NEGATIVE

## 2021-04-09 MED ORDER — IRON (FERROUS SULFATE) 325 (65 FE) MG PO TABS: 1.0000 | ORAL_TABLET | Freq: Every day | ORAL | 5 refills | Status: DC

## 2021-04-09 NOTE — Patient Instructions (Signed)
Kyphosis Kyphosis is a spinal disorder. It involves an excessive outward curve of the spine that causes abnormal rounding of the upper back. It occurs when the spinal bones (vertebrae) in the upper back (thoracic spine) become wedge-shaped and cause deformity. Kyphosis is sometimes called dowager's hump, hunchback, or roundback. It is most common among elderly people, but it can occur at any age. What are the causes? There are four main types of kyphosis, which have different causes. They include: Postural kyphosis. This type is caused by poor posture or slouching. It does not involve severe abnormalities in the bone structure of the spine. This is the most common type of kyphosis, and it usually becomes noticeable during adolescence. Congenital kyphosis. This is caused when the spine fails to develop normally while in the womb. A person is born with this type of kyphosis. Scheuermann kyphosis. In this type of kyphosis, several of the vertebrae are more triangular in shape for unknown reasons. The curved spine usually becomes noticeable during adolescence. Osteoporotic kyphosis. This type is caused by thinning and loss of density in the bones (osteoporosis), which results in small breaks (compression fractures) in the thoracic vertebrae. The compression fractures cause these vertebrae to become wedge-shaped over time. Other possible causes include: Certain syndromes, such as Marfan syndrome or Prader-Willi disease. Cancer and treatment for cancer. Cancer and its treatment can weaken the vertebrae and cause compression fractures. Disk degeneration. This refers to the drying out and shrinking of the soft, circular structures between the vertebrae as part of the natural aging process. What are the signs or symptoms? The symptoms of kyphosis vary based on the cause and how severe the curve is (the severity). Symptoms may include: Rounded shoulders. A visible hump on the back. Fatigue. Back pain (usually  mild). Spine stiffness. Muscle tightness in the back of the thighs (hamstrings). Loss of height. Severe symptoms of this condition include: Loss of feeling in affected areas of the back. Shortness of breath. Weakness, numbness, or tingling in the legs. How is this diagnosed? This condition may be diagnosed based on: A physical exam. Your health care provider may press on your spine to check for areas of tenderness. Severe cases of curvature will be noticeable by the rounding of the upper back. Milder cases may be harder to diagnose. Your medical history. Your health care provider may ask about your general health and your symptoms. X-rays. MRI. Tests of the nerves and nervous system (neurological tests). How is this treated? Treatment for this condition depends on your age and overall health, and the type and severity of your kyphosis. Non-surgical treatment may include: Observation. Your health care provider may monitor your condition over time to make sure that it does not cause problems or get worse. Physical therapy. This involves movements and exercises to strengthen the back. NSAIDs to help relieve back pain, such as aspirin, ibuprofen, and naproxen. Wearing a back brace to correct the curve. Surgery (spinal fusion or kyphoplasty) may be recommended if: You have congenital kyphosis. You have Scheuermann kyphosis with a curve greater than 75 degrees. You have severe back pain that does not improve with non-surgical treatment. Follow these instructions at home: Take over-the-counter and prescription medicines only as told by your health care provider. Exercise regularly. Ask your health care provider what activities and exercises are best for you. Keeping your back strong and flexible may help to relieve symptoms and prevent your condition from getting worse. If physical therapy was prescribed, do exercises as instructed. Maintain good  posture. If you were prescribed a back  brace: Wear the brace as told by your health care provider. Remove the brace for bathing. Avoid heavy lifting. Do not lift anything that is heavier than the limit that you are told until your health care provider says that it is safe. Keep all follow-up visits as told by your health care provider. This is important, especially if your health care provider is monitoring your condition. Contact a health care provider if: You notice a round hump forming on your back. You have pain that does not get better with medicine. Summary Kyphosis is a spinal disorder. It involves an excessive outward curve of the spine that causes abnormal rounding of the upper back. The symptoms of kyphosis vary based on the cause and how severe the curve is. There are non-surgical and surgical treatments for kyphosis. Exercise regularly. Ask your health care provider what activities and exercises are best for you. Keeping your back strong and flexible may help to relieve symptoms and prevent your condition from getting worse. This information is not intended to replace advice given to you by your health care provider. Make sure you discuss any questions you have with your health care provider. Document Revised: 06/06/2020 Document Reviewed: 06/06/2020 Elsevier Patient Education  2022 [employer].

## 2021-04-09 NOTE — Progress Notes (Signed)
Date:  04/09/2021   Name:  Richard Stafford   DOB:  1946-03-18   MRN:  [id number]   Chief Complaint: Annual Exam and Anemia (Has cut back from daily to 3 times weekly on ferrous sulfate)  MAURILIO LONER is a 75 y.o. male who presents today for his Complete Annual Exam. He feels well. He reports exercising daily. He reports he is sleeping well.    Anemia Presents for follow-up visit. There has been no abdominal pain, anorexia, bruising/bleeding easily, confusion, fever, malaise/fatigue, pallor, palpitations, paresthesias or pica.   Lab Results  Component Value Date   CREATININE 1.07 04/08/2020   BUN 14 04/08/2020   NA 138 04/08/2020   K 4.8 04/08/2020   CL 99 04/08/2020   CO2 24 04/08/2020   Lab Results  Component Value Date   CHOL 164 04/08/2020   HDL 43 04/08/2020   LDLCALC 96 04/08/2020   TRIG 140 04/08/2020   CHOLHDL 3.8 04/04/2018   Lab Results  Component Value Date   TSH 4.570 (H) 04/08/2020   No results found for: HGBA1C Lab Results  Component Value Date   WBC 5.9 04/08/2020   HGB 12.2 (L) 04/08/2020   HCT 37.8 04/08/2020   MCV 83 04/08/2020   PLT 340 04/08/2020   Lab Results  Component Value Date   ALT 27 04/08/2020   AST 35 04/08/2020   ALKPHOS 85 04/08/2020   BILITOT 0.6 04/08/2020     Review of Systems  Constitutional:  Negative for chills, fever and malaise/fatigue.  HENT:  Negative for drooling, ear discharge, ear pain and sore throat.   Respiratory:  Negative for cough, shortness of breath and wheezing.   Cardiovascular:  Negative for chest pain, palpitations and leg swelling.  Gastrointestinal:  Negative for abdominal pain, anorexia, blood in stool, constipation, diarrhea and nausea.  Endocrine: Negative for polydipsia.  Genitourinary:  Negative for dysuria, frequency, hematuria and urgency.  Musculoskeletal:  Negative for back pain, myalgias and neck pain.  Skin:  Negative for pallor and rash.  Allergic/Immunologic: Negative for  environmental allergies.  Neurological:  Negative for dizziness, headaches and paresthesias.  Hematological:  Does not bruise/bleed easily.  Psychiatric/Behavioral:  Negative for confusion and suicidal ideas. The patient is not nervous/anxious.    Patient Active Problem List   Diagnosis Date Noted   Incisional hernia, without obstruction or gangrene    Need for hepatitis C screening test 04/02/2017   Elevated blood pressure, situational 12/21/2014   Routine general medical examination at a health care facility 12/21/2014   Elevation of level of transaminase and lactic acid dehydrogenase (LDH) 12/21/2014   ED (erectile dysfunction) of organic origin 12/21/2014   HLD (hyperlipidemia) 12/21/2014    No Known Allergies  Past Surgical History:  Procedure Laterality Date   COLONOSCOPY  2014   cleared for 5 years- [place]   COLONOSCOPY WITH PROPOFOL N/A 04/11/2019   Procedure: COLONOSCOPY WITH PROPOFOL;  Surgeon: Lollie Sails, MD;  Location: [facility] ENDOSCOPY;  Service: Endoscopy;  Laterality: N/A;   CYST EXCISION     cyst in maxillary sinus removed   HERNIA REPAIR     INCISIONAL HERNIA REPAIR N/A 08/27/2020   Procedure: HERNIA REPAIR INCISIONAL;  Surgeon: Olean Ree, MD;  Location: [facility] ORS;  Service: General;  Laterality: N/A;   LAPAROSCOPIC INGUINAL HERNIA REPAIR Right    NASAL ENDOSCOPY Bilateral 01/07/2018   TOOTH EXTRACTION     varicose veins      Social History  Tobacco Use   Smoking status: Former    Packs/day: 1.00    Years: 4.00    Pack years: 4.00    Types: Cigarettes    Quit date: 1988    Years since quitting: 34.6   Smokeless tobacco: Never   Tobacco comments:    smoking cessation materials not required  Vaping Use   Vaping Use: Never used  Substance Use Topics   Alcohol use: Yes    Alcohol/week: 13.0 standard drinks    Types: 7 Glasses of wine, 6 Cans of beer per week   Drug use: No     Medication list has been reviewed and updated.  Current Meds   Medication Sig   Ascorbic Acid (VITAMIN C) 500 MG CAPS Take 500 mg by mouth daily.   aspirin 81 MG tablet Take 81 mg by mouth daily.   calcium carbonate (TUMS - DOSED IN MG ELEMENTAL CALCIUM) 500 MG chewable tablet Chew 1 tablet by mouth daily as needed for indigestion or heartburn.   docusate sodium (COLACE) 100 MG capsule Take 100 mg by mouth at bedtime.   finasteride (PROSCAR) 5 MG tablet Take 5 mg by mouth daily. Urology/ [facility]   Iron, Ferrous Sulfate, 325 (65 Fe) MG TABS Take 1 tablet by mouth daily. (Patient taking differently: Take 325 mg by mouth 3 (three) times a week.)   Misc Natural Products (GLUCOSAMINE CHONDROITIN TRIPLE) TABS Take 1 tablet by mouth daily.   Multiple Vitamins-Minerals (MENS MULTIVITAMIN PLUS PO) Take 1 tablet by mouth daily.   naproxen sodium (ALEVE) 220 MG tablet Take 220-440 mg by mouth daily as needed (pain).   Omega-3 Fatty Acids (FISH OIL) 1000 MG CAPS Take 1 capsule (1,000 mg total) by mouth daily.   sildenafil (REVATIO) 20 MG tablet Take 20-100 mg by mouth as needed (ED).   sodium chloride (OCEAN) 0.65 % SOLN nasal spray Place 1 spray into both nostrils 2 (two) times daily.   zinc gluconate 50 MG tablet Take 50 mg by mouth daily.    PHQ 2/9 Scores 04/09/2021 04/08/2020 04/07/2019 04/04/2018  PHQ - 2 Score 0 0 0 0  PHQ- 9 Score 0 1 0 1    GAD 7 : Generalized Anxiety Score 04/09/2021 04/08/2020  Nervous, Anxious, on Edge 0 0  Control/stop worrying 0 0  Worry too much - different things 0 0  Trouble relaxing 0 0  Restless 0 0  Easily annoyed or irritable 0 0  Afraid - awful might happen 0 0  Total GAD 7 Score 0 0    BP Readings from Last 3 Encounters:  04/09/21 120/62  09/09/20 (!) 146/73  08/27/20 136/80    Physical Exam Vitals and nursing note reviewed.  Constitutional:      Appearance: Normal appearance. He is well-groomed and overweight.  HENT:     Head: Normocephalic.     Jaw: There is normal jaw occlusion.     Right Ear: Hearing,  tympanic membrane, ear canal and external ear normal.     Left Ear: Hearing, tympanic membrane, ear canal and external ear normal.     Nose: Nose normal. No congestion or rhinorrhea.     Mouth/Throat:     Lips: Pink.     Mouth: Mucous membranes are moist.     Dentition: Normal dentition.     Tongue: No lesions.     Palate: No mass.     Pharynx: Oropharynx is clear. Uvula midline. No pharyngeal swelling, oropharyngeal exudate, posterior oropharyngeal erythema or  uvula swelling.     Tonsils: No tonsillar exudate or tonsillar abscesses.  Eyes:     General: Lids are normal. Vision grossly intact. Gaze aligned appropriately. No scleral icterus.       Right eye: No discharge.        Left eye: No discharge.     Extraocular Movements: Extraocular movements intact.     Right eye: Normal extraocular motion and no nystagmus.     Left eye: Normal extraocular motion and no nystagmus.     Conjunctiva/sclera: Conjunctivae normal.     Pupils: Pupils are equal, round, and reactive to light.  Neck:     Thyroid: No thyroid mass, thyromegaly or thyroid tenderness.     Vascular: No JVD.     Trachea: No tracheal deviation.  Cardiovascular:     Rate and Rhythm: Normal rate and regular rhythm.     Chest Wall: PMI is not displaced. No thrill.     Pulses: Normal pulses.          Dorsalis pedis pulses are 2+ on the right side and 2+ on the left side.       Posterior tibial pulses are 2+ on the right side and 2+ on the left side.     Heart sounds: Normal heart sounds, S1 normal and S2 normal. No murmur heard. No systolic murmur is present.  No diastolic murmur is present.    No friction rub. No gallop. No S3 or S4 sounds.  Pulmonary:     Effort: Pulmonary effort is normal. No respiratory distress.     Breath sounds: Normal breath sounds. No decreased air movement or transmitted upper airway sounds. No decreased breath sounds, wheezing, rhonchi or rales.  Chest:     Chest wall: No mass or tenderness.   Breasts:    Breasts are symmetrical.     Right: Normal. No mass.     Left: Normal. No mass.  Abdominal:     General: Bowel sounds are normal.     Palpations: Abdomen is soft. There is no hepatomegaly, splenomegaly or mass.     Tenderness: There is no abdominal tenderness. There is no right CVA tenderness, left CVA tenderness, guarding or rebound.  Genitourinary:    Penis: Normal.      Testes: Normal.        Right: Mass, tenderness or swelling not present.        Left: Mass, tenderness or swelling not present.     Epididymis:     Right: Normal.     Left: Normal.     Prostate: Normal.     Rectum: Normal. Guaiac result negative. No mass.  Musculoskeletal:        General: No tenderness. Normal range of motion.     Cervical back: Normal, full passive range of motion without pain, normal range of motion and neck supple.     Thoracic back: Deformity present.     Lumbar back: Normal.     Right lower leg: No edema.     Left lower leg: No edema.     Right foot: Normal range of motion.     Left foot: Normal range of motion.     Comments: kyphosis  Feet:     Right foot:     Skin integrity: Skin integrity normal.     Left foot:     Skin integrity: Skin integrity normal.  Lymphadenopathy:     Head:     Right side of head: No submental,  submandibular or tonsillar adenopathy.     Left side of head: No submental, submandibular or tonsillar adenopathy.     Cervical: No cervical adenopathy.     Right cervical: No superficial, deep or posterior cervical adenopathy.    Left cervical: No superficial, deep or posterior cervical adenopathy.     Upper Body:     Right upper body: No supraclavicular or axillary adenopathy.     Left upper body: No supraclavicular or axillary adenopathy.  Skin:    General: Skin is warm.     Capillary Refill: Capillary refill takes less than 2 seconds.     Findings: No rash.  Neurological:     Mental Status: He is alert and oriented to person, place, and time.      Cranial Nerves: Cranial nerves are intact. No cranial nerve deficit or facial asymmetry.     Sensory: Sensation is intact.     Motor: Motor function is intact.     Deep Tendon Reflexes: Reflexes are normal and symmetric.     Reflex Scores:      Tricep reflexes are 2+ on the right side and 2+ on the left side.      Bicep reflexes are 2+ on the right side and 2+ on the left side.      Brachioradialis reflexes are 2+ on the right side and 2+ on the left side.      Patellar reflexes are 2+ on the right side and 2+ on the left side.      Achilles reflexes are 2+ on the right side and 2+ on the left side.   Wt Readings from Last 3 Encounters:  04/09/21 217 lb (98.4 kg)  09/09/20 223 lb 6.4 oz (101.3 kg)  08/22/20 224 lb (101.6 kg)    BP 120/62   Pulse 60   Ht 6' (1.829 m)   Wt 217 lb (98.4 kg)   BMI 29.43 kg/m   Assessment and Plan: PACO FALKOWITZ is a 75 y.o. male who presents today for his Complete Annual Exam. He feels well. He reports exercising normal. He reports he is sleeping well.  Patient's chart was reviewed.  Blood pressure previous encounters most recent labs most recent imaging, most recent medication/medical concerns as well as care everywhere. 1. Annual physical exam Immunizations are reviewed and recommendations provided.   Age appropriate screening tests are discussed. Counseling given for risk factor reduction interventions.  No subjective/objective concerns noted during HPI, review of systems or physical exam.  Labs including TSH lipid panel PSA and renal panel. - POCT Occult Blood Stool - CBC w/Diff/Platelet - TSH - Lipid Panel With LDL/HDL Ratio - PSA - Renal Function Panel  2. Iron deficiency anemia, unspecified iron deficiency anemia type Patient with history of iron deficiency anemia which takes iron 3 times a week we will check CBC if sufficient dosing. - CBC w/Diff/Platelet - Iron, Ferrous Sulfate, 325 (65 Fe) MG TABS; Take 1 tablet by mouth daily.   Dispense: 30 tablet; Refill: 5  3. Elevated TSH Previous TSH has been elevated mildly.  We will recheck TSH to see if this has grass and if thyroid supplementation is necessary. - TSH  4. Hyperlipidemia, unspecified hyperlipidemia type Bated TSH we will check TSH for current level and adjust accordingly. - Lipid Panel With LDL/HDL Ratio  5. Nocturia Patient has occasional nocturia.  DRE was normal.  We will check PSA. - PSA  6. Elevation of level of transaminase and lactic acid dehydrogenase (LDH) Noted  history of elevated LDH and transaminases and we will check hepatic panel and LDH for current levels. - Lactate dehydrogenase; Future - Hepatic Function Panel (6)

## 2021-04-10 ENCOUNTER — Other Ambulatory Visit: Payer: Self-pay

## 2021-04-10 DIAGNOSIS — E039 Hypothyroidism, unspecified: Secondary | ICD-10-CM

## 2021-04-10 LAB — CBC WITH DIFFERENTIAL/PLATELET
Basophils Absolute: 0.1 10*3/uL (ref 0.0–0.2)
Basos: 1 %
EOS (ABSOLUTE): 0.2 10*3/uL (ref 0.0–0.4)
Eos: 2 %
Hematocrit: 42.6 % (ref 37.5–51.0)
Hemoglobin: 14.7 g/dL (ref 13.0–17.7)
Immature Grans (Abs): 0 10*3/uL (ref 0.0–0.1)
Immature Granulocytes: 0 %
Lymphocytes Absolute: 1.6 10*3/uL (ref 0.7–3.1)
Lymphs: 22 %
MCH: 32.8 pg (ref 26.6–33.0)
MCHC: 34.5 g/dL (ref 31.5–35.7)
MCV: 95 fL (ref 79–97)
Monocytes Absolute: 1 10*3/uL — ABNORMAL HIGH (ref 0.1–0.9)
Monocytes: 14 %
Neutrophils Absolute: 4.4 10*3/uL (ref 1.4–7.0)
Neutrophils: 61 %
Platelets: 228 10*3/uL (ref 150–450)
RBC: 4.48 x10E6/uL (ref 4.14–5.80)
RDW: 11.8 % (ref 11.6–15.4)
WBC: 7.2 10*3/uL (ref 3.4–10.8)

## 2021-04-10 LAB — RENAL FUNCTION PANEL
Albumin: 4.7 g/dL (ref 3.7–4.7)
BUN/Creatinine Ratio: 15 (ref 10–24)
BUN: 15 mg/dL (ref 8–27)
CO2: 25 mmol/L (ref 20–29)
Calcium: 9.4 mg/dL (ref 8.6–10.2)
Chloride: 100 mmol/L (ref 96–106)
Creatinine, Ser: 1.03 mg/dL (ref 0.76–1.27)
Glucose: 102 mg/dL — ABNORMAL HIGH (ref 65–99)
Phosphorus: 3.8 mg/dL (ref 2.8–4.1)
Potassium: 5.1 mmol/L (ref 3.5–5.2)
Sodium: 139 mmol/L (ref 134–144)
eGFR: 77 mL/min/{1.73_m2} (ref >59)

## 2021-04-10 LAB — PSA: Prostate Specific Ag, Serum: 1.4 ng/mL (ref 0.0–4.0)

## 2021-04-10 LAB — LIPID PANEL WITH LDL/HDL RATIO
Cholesterol, Total: 171 mg/dL (ref 100–199)
HDL: 61 mg/dL (ref >39)
LDL Chol Calc (NIH): 96 mg/dL (ref 0–99)
LDL/HDL Ratio: 1.6 ratio (ref 0.0–3.6)
Triglycerides: 72 mg/dL (ref 0–149)
VLDL Cholesterol Cal: 14 mg/dL (ref 5–40)

## 2021-04-10 LAB — TSH: TSH: 5.07 u[IU]/mL — ABNORMAL HIGH (ref 0.450–4.500)

## 2021-04-10 MED ORDER — LEVOTHYROXINE SODIUM 25 MCG PO TABS: 25.0000 ug | ORAL_TABLET | Freq: Every day | ORAL | 0 refills | Status: DC

## 2021-04-10 NOTE — Progress Notes (Signed)
Sent in levothyroxine

## 2021-04-20 ENCOUNTER — Telehealth: Payer: Self-pay | Admitting: *Deleted

## 2021-04-20 NOTE — Telephone Encounter (Signed)
LMOM for patient to call office to schedule appt for his AWV. Call back number provided.

## 2021-04-23 ENCOUNTER — Ambulatory Visit
Admission: RE | Admit: 2021-04-23 | Discharge: 2021-04-23 | Disposition: A | Discharge status: Home / Self Care | Payer: Medicare PPO | Source: Ambulatory Visit | Attending: Family Medicine | Admitting: Family Medicine

## 2021-04-23 ENCOUNTER — Ambulatory Visit: Payer: Medicare PPO | Admitting: Family Medicine

## 2021-04-23 ENCOUNTER — Other Ambulatory Visit: Payer: Self-pay

## 2021-04-23 ENCOUNTER — Encounter: Payer: Self-pay | Admitting: Family Medicine

## 2021-04-23 ENCOUNTER — Ambulatory Visit
Admission: RE | Admit: 2021-04-23 | Discharge: 2021-04-23 | Disposition: A | Discharge status: Home / Self Care | Payer: Medicare PPO | Attending: Family Medicine | Admitting: Family Medicine

## 2021-04-23 ENCOUNTER — Inpatient Hospital Stay: Payer: Self-pay | Admitting: Radiology

## 2021-04-23 VITALS — BP 134/76 | HR 52 | Temp 98.5°F | Ht 72.0 in | Wt 213.0 lb

## 2021-04-23 DIAGNOSIS — G8929 Other chronic pain: Secondary | ICD-10-CM | POA: Insufficient documentation

## 2021-04-23 DIAGNOSIS — M25561 Pain in right knee: Secondary | ICD-10-CM | POA: Diagnosis not present

## 2021-04-23 DIAGNOSIS — M25461 Effusion, right knee: Secondary | ICD-10-CM

## 2021-04-23 DIAGNOSIS — M25562 Pain in left knee: Secondary | ICD-10-CM | POA: Diagnosis not present

## 2021-04-23 DIAGNOSIS — M170 Bilateral primary osteoarthritis of knee: Secondary | ICD-10-CM | POA: Diagnosis not present

## 2021-04-23 MED ORDER — NAPROXEN 500 MG PO TABS: 500.0000 mg | ORAL_TABLET | Freq: Two times a day (BID) | ORAL | 3 refills | Status: AC | PRN

## 2021-04-23 MED ORDER — TRIAMCINOLONE ACETONIDE 40 MG/ML IJ SUSP
40.0000 mg | Freq: Once | INTRAMUSCULAR | Status: AC
  Administered 2021-04-23: 40 mg

## 2021-04-23 NOTE — Progress Notes (Signed)
Primary Care / Sports Medicine Office Visit  Patient Information:  Patient ID: Richard Stafford, male DOB: [birth year] Age: 75 y.o. MRN: [id number]   Richard Stafford is a pleasant 75 y.o. male presenting with the following:  Chief Complaint  Patient presents with   New Patient (Initial Visit)   Knee Pain    Bilateral; left greater than right; x20 years, but has become more severe the past 3 years; uses a cane with long walks and is having more difficulty walking up and down stairs; achy, "catching and twinge" upon standing; takes naproxen with some relief of inflammation;  no recent X-Rays; 5/10 pain    Review of Systems pertinent details above   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   HLD (hyperlipidemia) 12/21/2014   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   Prostatic hyperplasia, benign localized, with obstruction 05/16/2014   Hx of adenomatous colonic polyps 01/19/2014   Past Medical History:  Diagnosis Date   GERD (gastroesophageal reflux disease)    History of colon polyps    Hyperlipidemia    Outpatient Encounter Medications as of 04/23/2021  Medication Sig   Ascorbic Acid (VITAMIN C) 500 MG CAPS Take 500 mg by mouth daily.   aspirin 81 MG tablet Take 81 mg by mouth daily.   calcium carbonate (TUMS - DOSED IN MG ELEMENTAL CALCIUM) 500 MG chewable tablet Chew 1 tablet by mouth daily as  needed for indigestion or heartburn.   docusate sodium (COLACE) 100 MG capsule Take 100 mg by mouth at bedtime.   finasteride (PROSCAR) 5 MG tablet Take 5 mg by mouth daily. Urology/ [facility]   Iron, Ferrous Sulfate, 325 (65 Fe) MG TABS Take 1 tablet by mouth daily. (Patient taking differently: Take 1 tablet by mouth every other day.)   levothyroxine (SYNTHROID) 25 MCG tablet Take 1 tablet (25 mcg total) by mouth daily.   Misc Natural Products (GLUCOSAMINE CHONDROITIN TRIPLE) TABS Take 1 tablet by mouth daily.   Multiple Vitamins-Minerals (MENS MULTIVITAMIN PLUS PO) Take 1 tablet by mouth daily.   naproxen (NAPROSYN) 500 MG tablet Take 1 tablet (500 mg total) by mouth 2 (two) times daily as needed for moderate pain.   Omega-3 Fatty Acids (FISH OIL) 1000 MG CAPS Take 1 capsule (1,000 mg total) by mouth daily.   sildenafil (REVATIO) 20 MG tablet Take 20-100 mg by mouth as needed (ED).   sodium chloride (OCEAN) 0.65 % SOLN nasal spray Place 1 spray into both nostrils 2 (two) times daily.   [DISCONTINUED] naproxen sodium (ALEVE) 220 MG tablet Take 220-440 mg by mouth daily as needed (pain).   [DISCONTINUED] zinc gluconate 50 MG tablet Take 50 mg by mouth daily.   [EXPIRED] triamcinolone acetonide (KENALOG-40) injection 40 mg    No facility-administered encounter medications on file as of 04/23/2021.   Past Surgical History:  Procedure Laterality Date   COLONOSCOPY  2014   cleared  for 5 years- [place] WITH PROPOFOL N/A 04/11/2019   Procedure: COLONOSCOPY WITH PROPOFOL;  Surgeon: Lollie Sails, MD;  Location[facility] ENDOSCOPY;  Service: Endoscopy;  Laterality: N/A;   CYST EXCISION     cyst in maxillary sinus removed   HERNIA REPAIR     INCISIONAL HERNIA REPAIR N/A 08/27/2020   Procedure: HERNIA REPAIR INCISIONAL;  Surgeon: Olean Ree, MD;  Location: [facility] ORS;  Service: General;  Laterality: N/A;   LAPAROSCOPIC INGUINAL HERNIA REPAIR Right    NASAL ENDOSCOPY Bilateral 01/07/2018    TOOTH EXTRACTION     varicose veins      Vitals:   04/23/21 0801  BP: 134/76  Pulse: (!) 52  Temp: 98.5 F (36.9 C)  SpO2: 98%   Vitals:   04/23/21 0801  Weight: 213 lb (96.6 kg)  Height: 6' (1.829 m)   Body mass index is 28.89 kg/m.  No results found.   Independent interpretation of notes and tests performed by another provider:   None  Procedures performed:   Procedure: Aspiration and injection of right knee under ultrasound guidance. Ultrasound guidance utilized to visualize effusion at the suprapatellar region, for needle placement, confirmation of complete aspiration, and injection Samsung HS60 device utilized with permanent recording / reporting. Consent obtained and verified. Skin prepped in a sterile fashion. Ethyl chloride spray for topical local analgesia.  Completed without difficulty and tolerated well. 7 mL straw-colored clear fluid aspirated. Medication: triamcinolone acetonide 40 mg/mL suspension for injection 1 mL total and 2 mL lidocaine 1% without epinephrine utilized for needle placement anesthetic Advised to contact for fevers/chills, erythema, induration, drainage, or persistent bleeding.   Pertinent History, Exam, Impression, and Recommendations:   Knee effusion, right Patient with 20-year history of right greater than left knee pain, does relay an athletic injury decades prior to that.  Pain noted diffusely in both knees, left knee has only recently been more symptomatic, symptoms are aggravated by prolonged weightbearing activity (walking 2 miles) and descending stairs.  He has noted increasing instability on his right knee and has utilized a cane to aid with this, additionally dosing 2 tablets OTC naproxen on an as-needed basis with improvement.  Examination today shows right knee with 1-2+ effusion, tenderness at the lateral patellar facet, medial joint line, minimally to the pes anserine bursa, range of motion limited from 0-120 degrees, limited  by pain, contralateral knee with minimal tenderness at the medial joint line, range of motion from 0 - 130 degrees, no laxity with anterior/posterior drawer, valgus/varus stressing bilaterally, McMurray's benign bilaterally.  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  We will further evaluate this with dedicated plain films, he did elect to proceed with ultrasound-guided aspiration followed by corticosteroid injection of his right knee given the instability and effusion noted today.  Lastly, he can dose naproxen 5 mg twice daily as needed.  We will seek authorization for viscosupplementation and coordinate a follow-up accordingly.  Ultimately, once symptom control achieved, he would benefit from formal home-based rehab for strengthening and further nonsurgical optimization.  Chronic pain of right knee See additional assessment(s) for plan details.  Chronic pain of left knee See additional assessment(s) for plan details.    Orders & Medications Meds ordered this encounter  Medications   naproxen (NAPROSYN) 500 MG tablet    Sig: Take 1 tablet (500  mg total) by mouth 2 (two) times daily as needed for moderate pain.    Dispense:  60 tablet    Refill:  3   triamcinolone acetonide (KENALOG-40) injection 40 mg   Orders Placed This Encounter  Procedures   [place] LIMITED JOINT SPACE STRUCTURES LOW RIGHT   DG Knee Complete 4 Views Right   DG Knee Complete 4 Views Left     Return if symptoms worsen or fail to improve.     Montel Culver, MD   Primary Care Sports Medicine [place]

## 2021-04-23 NOTE — Assessment & Plan Note (Signed)
See additional assessment(s) for plan details. 

## 2021-04-23 NOTE — Patient Instructions (Addendum)
You have just been given a cortisone injection to reduce pain and inflammation. After the injection you may notice immediate relief of pain as a result of the Lidocaine. It is important to rest the area of the injection for 24 to 48 hours after the injection. There is a possibility of some temporary increased discomfort and swelling for up to 72 hours until the cortisone begins to work. If you do have pain, simply rest the joint and use ice. If you can tolerate over the counter medications, you can try Tylenol, Aleve, or Advil for added relief per package instructions. - Proceed downstairs for x-rays - Use naproxen 500 mg twice daily as-needed (take with food) - We will contact you for next steps after seeking authorization for "gel injections" (viscosupplementation)

## 2021-04-23 NOTE — Assessment & Plan Note (Signed)
Patient with 20-year history of right greater than left knee pain, does relay an athletic injury decades prior to that.  Pain noted diffusely in both knees, left knee has only recently been more symptomatic, symptoms are aggravated by prolonged weightbearing activity (walking 2 miles) and descending stairs.  He has noted increasing instability on his right knee and has utilized a cane to aid with this, additionally dosing 2 tablets OTC naproxen on an as-needed basis with improvement.  Examination today shows right knee with 1-2+ effusion, tenderness at the lateral patellar facet, medial joint line, minimally to the pes anserine bursa, range of motion limited from 0-120 degrees, limited by pain, contralateral knee with minimal tenderness at the medial joint line, range of motion from 0 - 130 degrees, no laxity with anterior/posterior drawer, valgus/varus stressing bilaterally, McMurray's benign bilaterally.  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  We will further evaluate this with dedicated plain films, he did elect to proceed with ultrasound-guided aspiration followed by corticosteroid injection of his right knee given the instability and effusion noted today.  Lastly, he can dose naproxen 5 mg twice daily as needed.  We will seek authorization for viscosupplementation and coordinate a follow-up accordingly.  Ultimately, once symptom control achieved, he would benefit from formal home-based rehab for strengthening and further nonsurgical optimization.

## 2021-04-24 NOTE — Progress Notes (Signed)
Please seek authorization for bilateral viscosupplementation injections. Thanks

## 2021-04-25 ENCOUNTER — Telehealth: Payer: Self-pay | Admitting: *Deleted

## 2021-04-25 NOTE — Telephone Encounter (Signed)
LMOM for patient to call office to sch his AWV. Office number provided on VM

## 2021-04-29 DIAGNOSIS — R350 Frequency of micturition: Secondary | ICD-10-CM | POA: Diagnosis not present

## 2021-04-29 DIAGNOSIS — N529 Male erectile dysfunction, unspecified: Secondary | ICD-10-CM | POA: Diagnosis not present

## 2021-04-29 DIAGNOSIS — Z6829 Body mass index (BMI) 29.0-29.9, adult: Secondary | ICD-10-CM | POA: Diagnosis not present

## 2021-04-29 DIAGNOSIS — N138 Other obstructive and reflux uropathy: Secondary | ICD-10-CM | POA: Diagnosis not present

## 2021-04-29 DIAGNOSIS — R351 Nocturia: Secondary | ICD-10-CM | POA: Diagnosis not present

## 2021-04-29 DIAGNOSIS — R3915 Urgency of urination: Secondary | ICD-10-CM | POA: Diagnosis not present

## 2021-04-29 DIAGNOSIS — Z79890 Hormone replacement therapy (postmenopausal): Secondary | ICD-10-CM | POA: Diagnosis not present

## 2021-04-29 DIAGNOSIS — Z7982 Long term (current) use of aspirin: Secondary | ICD-10-CM | POA: Diagnosis not present

## 2021-04-29 DIAGNOSIS — N401 Enlarged prostate with lower urinary tract symptoms: Secondary | ICD-10-CM | POA: Diagnosis not present

## 2021-04-29 DIAGNOSIS — Z125 Encounter for screening for malignant neoplasm of prostate: Secondary | ICD-10-CM | POA: Diagnosis not present

## 2021-04-29 DIAGNOSIS — R972 Elevated prostate specific antigen [PSA]: Secondary | ICD-10-CM | POA: Diagnosis not present

## 2021-06-03 ENCOUNTER — Other Ambulatory Visit: Payer: Self-pay

## 2021-06-03 ENCOUNTER — Other Ambulatory Visit: Payer: Medicare PPO

## 2021-06-03 DIAGNOSIS — E039 Hypothyroidism, unspecified: Secondary | ICD-10-CM

## 2021-06-04 LAB — THYROID PANEL WITH TSH
Free Thyroxine Index: 1.8 (ref 1.2–4.9)
T3 Uptake Ratio: 28 % (ref 24–39)
T4, Total: 6.5 ug/dL (ref 4.5–12.0)
TSH: 5.53 u[IU]/mL — ABNORMAL HIGH (ref 0.450–4.500)

## 2021-06-05 ENCOUNTER — Other Ambulatory Visit: Payer: Self-pay

## 2021-06-05 DIAGNOSIS — E039 Hypothyroidism, unspecified: Secondary | ICD-10-CM

## 2021-06-05 MED ORDER — LEVOTHYROXINE SODIUM 50 MCG PO TABS: 50.0000 ug | ORAL_TABLET | ORAL | 1 refills | Status: DC

## 2021-06-05 MED ORDER — LEVOTHYROXINE SODIUM 25 MCG PO TABS
25.0000 ug | ORAL_TABLET | ORAL | 1 refills | Status: DC
Start: 2021-06-05 — End: 2021-08-13

## 2021-08-08 ENCOUNTER — Other Ambulatory Visit: Payer: Self-pay

## 2021-08-08 ENCOUNTER — Telehealth: Payer: Self-pay

## 2021-08-08 DIAGNOSIS — E039 Hypothyroidism, unspecified: Secondary | ICD-10-CM

## 2021-08-08 NOTE — Telephone Encounter (Signed)
Patient informed he needs to come for another recheck of TSH next week. Labs placed up front for pick up Tuesday morning.

## 2021-08-08 NOTE — Telephone Encounter (Signed)
Copied from [place] [phone]. Topic: General - Other >> Aug 08, 2021 10:57 AM Yvette Rack wrote: Reason for CRM: Pt wife stated she needs to speak with Baxter Flattery regarding a complicated matter with pt Rx for levothyroxine (SYNTHROID) 50 MCG tablet. Pt wife requests return call at [phone]

## 2021-08-12 DIAGNOSIS — E039 Hypothyroidism, unspecified: Secondary | ICD-10-CM | POA: Diagnosis not present

## 2021-08-13 ENCOUNTER — Other Ambulatory Visit: Payer: Self-pay

## 2021-08-13 DIAGNOSIS — E039 Hypothyroidism, unspecified: Secondary | ICD-10-CM

## 2021-08-13 LAB — TSH+FREE T4
Free T4: 1.21 ng/dL (ref 0.82–1.77)
TSH: 4.41 u[IU]/mL (ref 0.450–4.500)

## 2021-08-13 MED ORDER — LEVOTHYROXINE SODIUM 50 MCG PO TABS: 50.0000 ug | ORAL_TABLET | ORAL | 0 refills | Status: DC

## 2021-08-13 MED ORDER — LEVOTHYROXINE SODIUM 25 MCG PO TABS: 25.0000 ug | ORAL_TABLET | ORAL | 0 refills | Status: DC

## 2021-11-27 ENCOUNTER — Other Ambulatory Visit: Payer: Self-pay | Admitting: Family Medicine

## 2021-11-27 DIAGNOSIS — E039 Hypothyroidism, unspecified: Secondary | ICD-10-CM

## 2021-12-08 IMAGING — CR DG KNEE COMPLETE 4+V*R*
4 series · 4 of 4 positions shown · non-contrast
Comparison: Left knee radiograph dated 04/23/2021.

CLINICAL DATA: Chronic right knee pain.

EXAM:
RIGHT KNEE - COMPLETE 4+ VIEW

[knee ap]
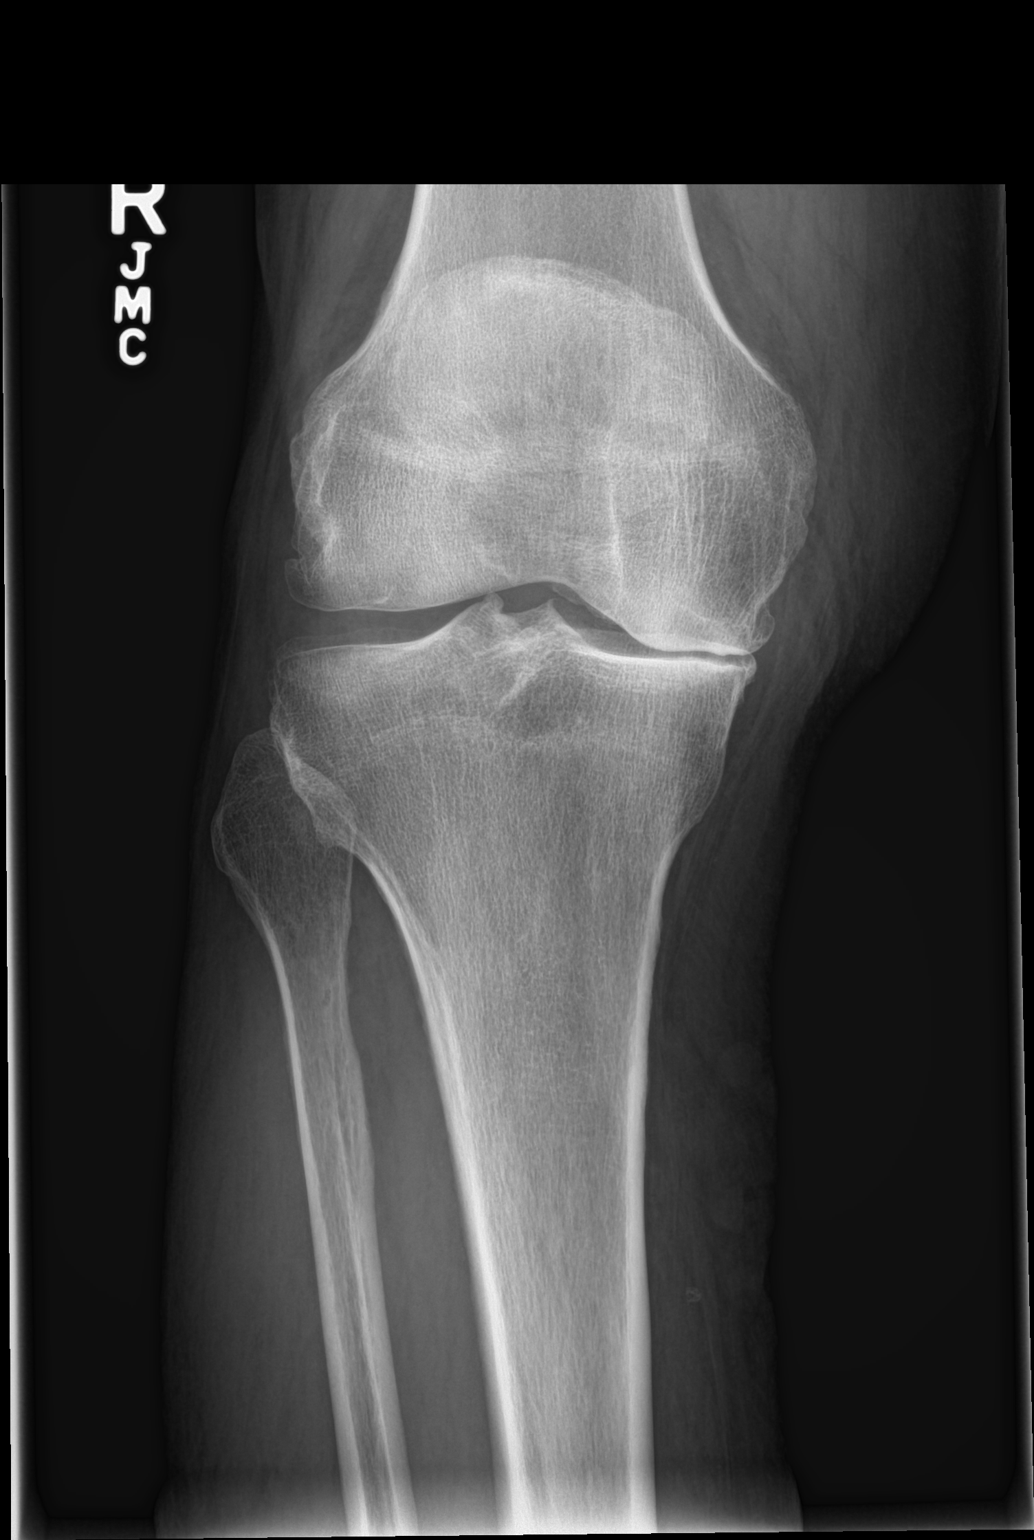

[knee lat]
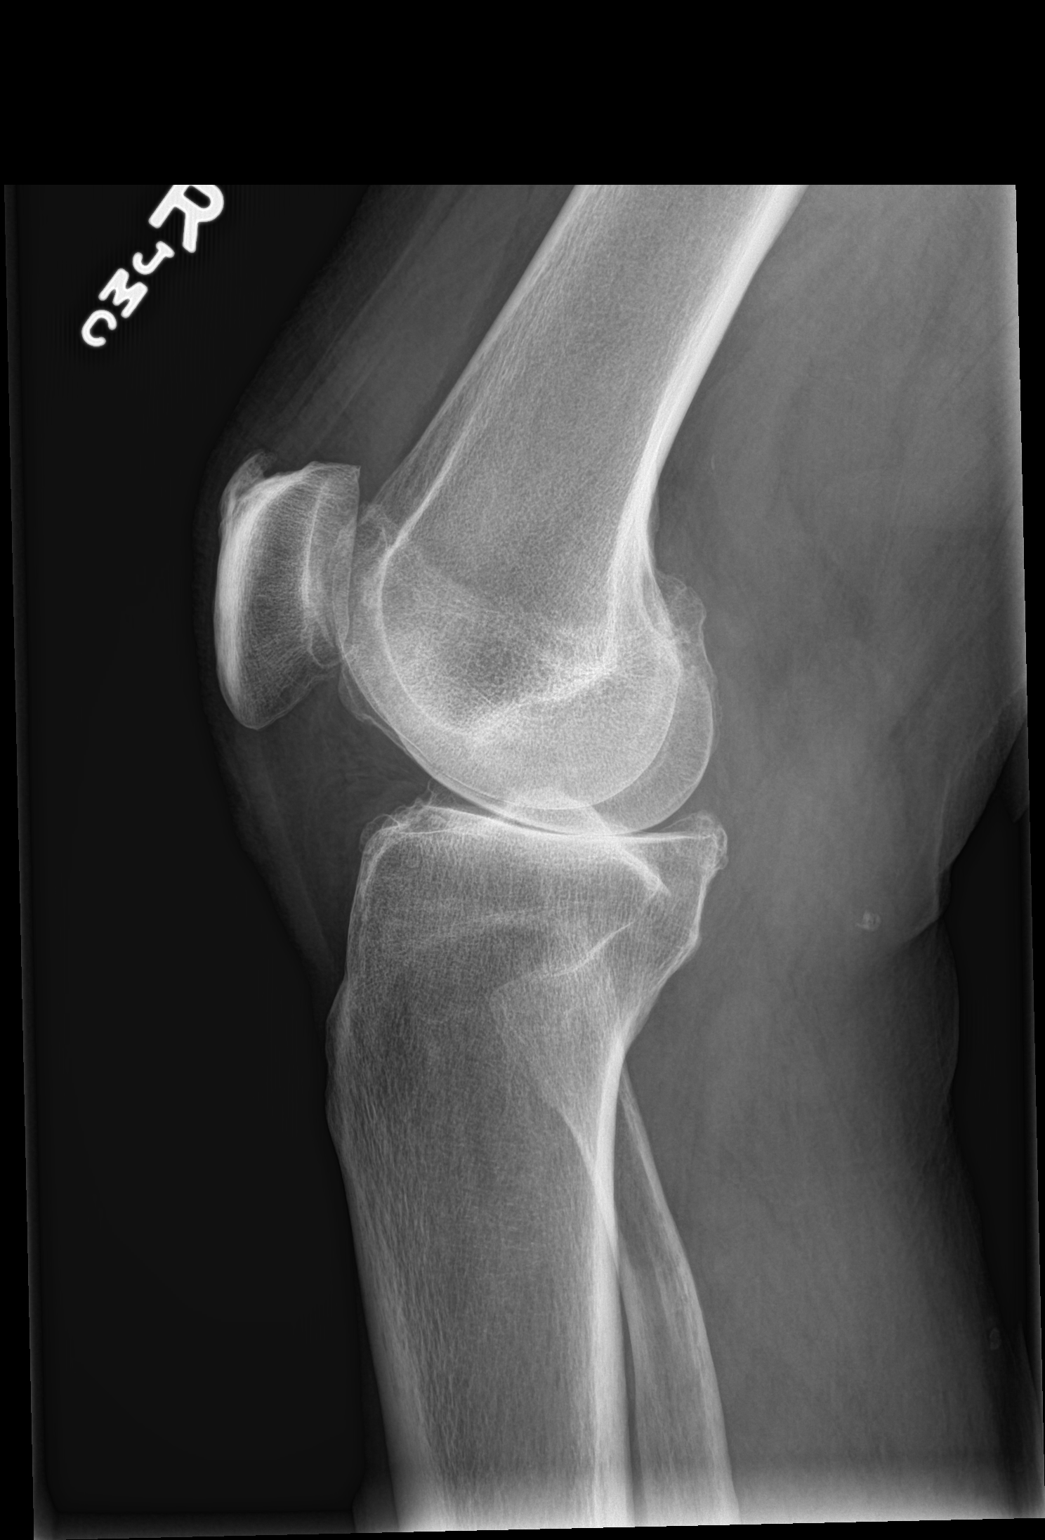

[tunnel]
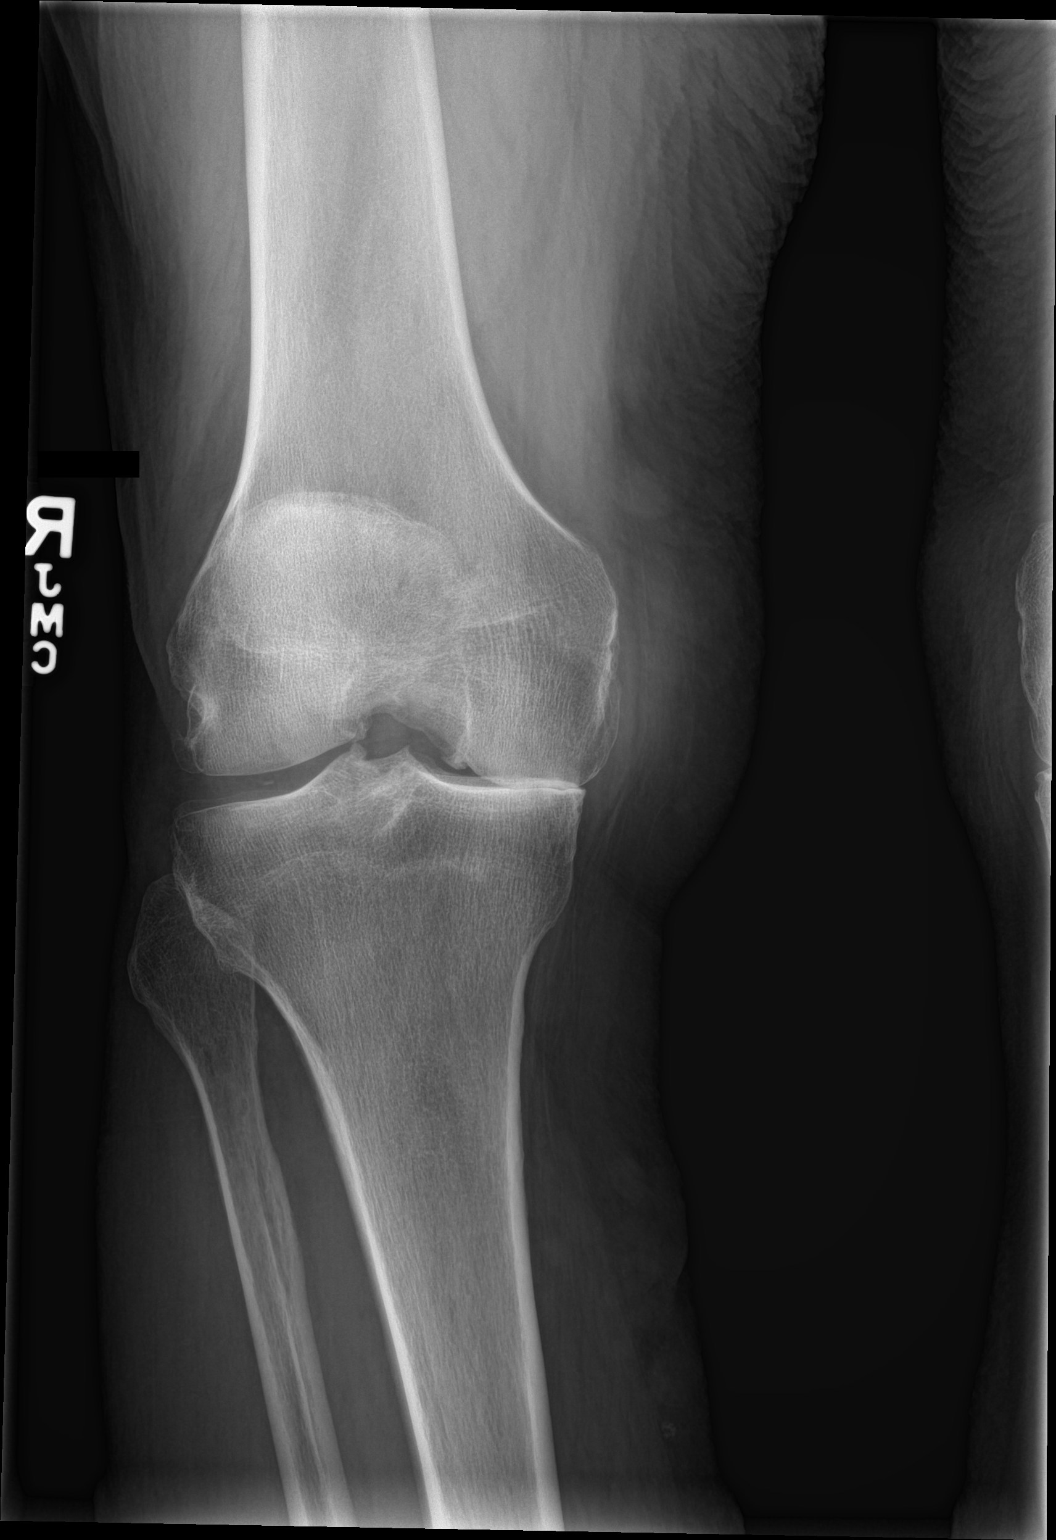

[patella skyline]
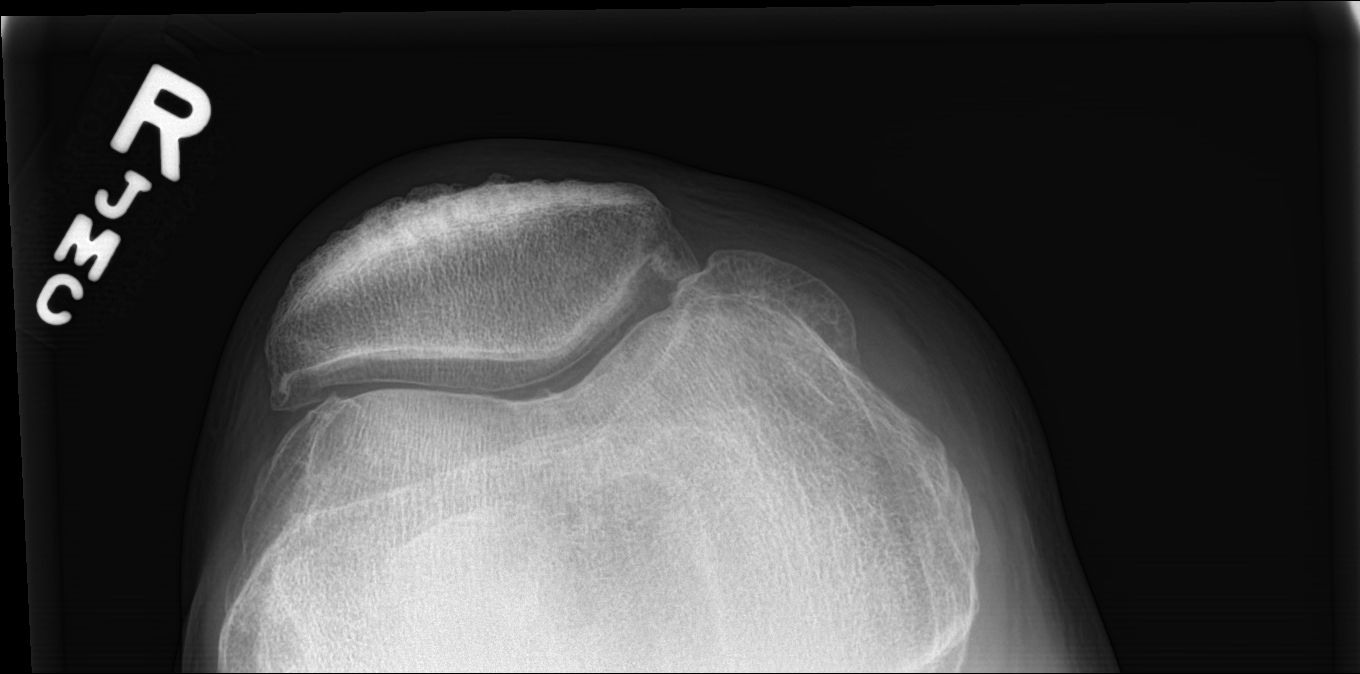

[4 of 4 positions shown; findings below may reference images not displayed]

FINDINGS: There is no acute fracture or dislocation. The bones are osteopenic.
There is arthritic changes of the right knee with tricompartmental
narrowing and severe narrowing of the medial compartment. There is a
small suprapatellar effusion. Soft tissues are unremarkable.
IMPRESSION: 1. No acute fracture or dislocation.
2. Osteoarthritic changes of the right knee.

## 2021-12-22 ENCOUNTER — Other Ambulatory Visit: Payer: Self-pay | Admitting: Family Medicine

## 2021-12-22 ENCOUNTER — Ambulatory Visit: Payer: Medicare PPO | Admitting: Family Medicine

## 2021-12-22 ENCOUNTER — Encounter: Payer: Self-pay | Admitting: Family Medicine

## 2021-12-22 VITALS — BP 124/80 | HR 60 | Ht 72.0 in | Wt 217.0 lb

## 2021-12-22 DIAGNOSIS — E039 Hypothyroidism, unspecified: Secondary | ICD-10-CM

## 2021-12-22 DIAGNOSIS — D509 Iron deficiency anemia, unspecified: Secondary | ICD-10-CM

## 2021-12-22 DIAGNOSIS — R030 Elevated blood-pressure reading, without diagnosis of hypertension: Secondary | ICD-10-CM | POA: Diagnosis not present

## 2021-12-22 MED ORDER — LEVOTHYROXINE SODIUM 25 MCG PO TABS: 25.0000 ug | ORAL_TABLET | ORAL | 0 refills | Status: DC

## 2021-12-22 MED ORDER — IRON (FERROUS SULFATE) 325 (65 FE) MG PO TABS: 1.0000 | ORAL_TABLET | Freq: Every day | ORAL | 1 refills | Status: DC

## 2021-12-22 MED ORDER — LEVOTHYROXINE SODIUM 50 MCG PO TABS: ORAL_TABLET | ORAL | 1 refills | Status: DC

## 2021-12-22 NOTE — Progress Notes (Signed)
? ? ?Date:  12/22/2021  ? ?Name:  Richard Stafford   DOB:  08/06/1946   MRN:  [id number] ? ? ?Chief Complaint: Hypothyroidism and Anemia ? ?Anemia ?Presents for follow-up visit. Symptoms include light-headedness. There has been no abdominal pain, bruising/bleeding easily, fever, malaise/fatigue, palpitations or weight loss. There is no history of chronic renal disease or hypothyroidism.  ?Thyroid Problem ?Presents for follow-up visit. Symptoms include constipation. Patient reports no anxiety, depressed mood, diaphoresis, diarrhea, heat intolerance, hoarse voice, palpitations, weight gain or weight loss. His past medical history is significant for diabetes and hyperlipidemia.  ?Hyperlipidemia ?This is a chronic problem. The current episode started more than 1 year ago. The problem is controlled. Recent lipid tests were reviewed and are normal. Exacerbating diseases include diabetes. He has no history of chronic renal disease, hypothyroidism, liver disease, obesity or nephrotic syndrome. Pertinent negatives include no chest pain, focal sensory loss, focal weakness, leg pain, myalgias or shortness of breath. Treatments tried: omega 3. The current treatment provides mild improvement of lipids. There are no compliance problems.   ? ?Lab Results  ?Component Value Date  ? NA 139 04/09/2021  ? K 5.1 04/09/2021  ? CO2 25 04/09/2021  ? GLUCOSE 102 (H) 04/09/2021  ? BUN 15 04/09/2021  ? CREATININE 1.03 04/09/2021  ? CALCIUM 9.4 04/09/2021  ? EGFR 77 04/09/2021  ? GFRNONAA 69 04/08/2020  ? ?Lab Results  ?Component Value Date  ? CHOL 171 04/09/2021  ? HDL 61 04/09/2021  ? [place] 96 04/09/2021  ? TRIG 72 04/09/2021  ? CHOLHDL 3.8 04/04/2018  ? ?Lab Results  ?Component Value Date  ? TSH 4.410 08/12/2021  ? ?No results found for: HGBA1C ?Lab Results  ?Component Value Date  ? WBC 7.2 04/09/2021  ? HGB 14.7 04/09/2021  ? HCT 42.6 04/09/2021  ? MCV 95 04/09/2021  ? PLT 228 04/09/2021  ? ?Lab Results  ?Component Value Date  ? ALT 27  04/08/2020  ? AST 35 04/08/2020  ? ALKPHOS 85 04/08/2020  ? BILITOT 0.6 04/08/2020  ? ?No results found for: 25OHVITD2, [place], VD25OH  ? ?Review of Systems  ?Constitutional:  Negative for chills, diaphoresis, fever, malaise/fatigue, weight gain and weight loss.  ?HENT:  Negative for drooling, ear discharge, ear pain, hoarse voice and sore throat.   ?Respiratory:  Negative for cough, shortness of breath and wheezing.   ?Cardiovascular:  Negative for chest pain, palpitations and leg swelling.  ?Gastrointestinal:  Positive for constipation. Negative for abdominal pain, blood in stool, diarrhea and nausea.  ?Endocrine: Negative for heat intolerance and polydipsia.  ?Genitourinary:  Negative for dysuria, frequency, hematuria and urgency.  ?Musculoskeletal:  Negative for back pain, myalgias and neck pain.  ?Skin:  Negative for rash.  ?Allergic/Immunologic: Negative for environmental allergies.  ?Neurological:  Positive for light-headedness. Negative for dizziness, focal weakness and headaches.  ?Hematological:  Does not bruise/bleed easily.  ?Psychiatric/Behavioral:  Negative for suicidal ideas. The patient is not nervous/anxious.   ? ?Patient Active Problem List  ? Diagnosis Date Noted  ? Knee effusion, right 04/23/2021  ? Chronic pain of right knee 04/23/2021  ? Chronic pain of left knee 04/23/2021  ? Incisional hernia, without obstruction or gangrene   ? Need for hepatitis C screening test 04/02/2017  ? 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 Incomplete emptying of bladder 05/17/2015  ? Elevated blood pressure, situational 12/21/2014  ? Routine general medical examination at a health care facility 12/21/2014  ? Elevation of level of transaminase  and lactic acid dehydrogenase (LDH) 12/21/2014  ? 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 HLD (hyperlipidemia) 12/21/2014  ? 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 Prostatic hyperplasia, benign localized, with obstruction 05/16/2014  ? Hx  of adenomatous colonic polyps 01/19/2014  ? ? ?No Known Allergies ? ?Past Surgical History:  ?Procedure Laterality Date  ? COLONOSCOPY  2014  ? cleared for 5 years- [place] docs  ? COLONOSCOPY WITH PROPOFOL N/A 04/11/2019  ? Procedure: COLONOSCOPY WITH PROPOFOL;  Surgeon: Lollie Sails, MD;  Location: [facility] ENDOSCOPY;  Service: Endoscopy;  Laterality: N/A;  ? CYST EXCISION    ? cyst in maxillary sinus removed  ? HERNIA REPAIR    ? INCISIONAL HERNIA REPAIR N/A 08/27/2020  ? Procedure: HERNIA REPAIR INCISIONAL;  Surgeon: Olean Ree, MD;  Location: [facility] ORS;  Service: General;  Laterality: N/A;  ? LAPAROSCOPIC INGUINAL HERNIA REPAIR Right   ? NASAL ENDOSCOPY Bilateral 01/07/2018  ? TOOTH EXTRACTION    ? varicose veins    ? ? ?Social History  ? ?Tobacco Use  ? Smoking status: Former  ?  Packs/day: 1.00  ?  Years: 4.00  ?  Pack years: 4.00  ?  Types: Cigarettes  ?  Quit date: 21  ?  Years since quitting: 35.3  ? Smokeless tobacco: Never  ?Vaping Use  ? Vaping Use: Never used  ?Substance Use Topics  ? Alcohol use: Yes  ?  Alcohol/week: 13.0 standard drinks  ?  Types: 7 Glasses of wine, 6 Cans of beer per week  ? Drug use: Never  ? ? ? ?Medication list has been reviewed and updated. ? ?Current Meds  ?Medication Sig  ? Ascorbic Acid (VITAMIN C) 500 MG CAPS Take 500 mg by mouth daily.  ? aspirin 81 MG tablet Take 81 mg by mouth daily.  ? calcium carbonate (TUMS - DOSED IN MG ELEMENTAL CALCIUM) 500 MG chewable tablet Chew 1 tablet by mouth daily as needed for indigestion or heartburn.  ? docusate sodium (COLACE) 100 MG capsule Take 100 mg by mouth at bedtime.  ? finasteride (PROSCAR) 5 MG tablet Take 5 mg by mouth daily. Urology/ [facility]  ? Iron, Ferrous Sulfate, 325 (65 Fe) MG TABS Take 1 tablet by mouth daily.  ? levothyroxine (SYNTHROID) 25 MCG tablet Take 1 tablet (25 mcg total) by mouth as directed. Take 1 tablet on Tuesday, Thursday, Saturday and Sunday  ? levothyroxine (SYNTHROID) 50 MCG tablet TAKE 1 TABLET BY MOUTH ON  MONDAY, WEDNESDAY, FRIDAY AS DIRECTED  ? Misc Natural Products (GLUCOSAMINE CHONDROITIN TRIPLE) TABS Take 1 tablet by mouth daily.  ? Multiple Vitamins-Minerals (MENS MULTIVITAMIN PLUS PO) Take 1 tablet by mouth daily.  ? naproxen (NAPROSYN) 500 MG tablet Take 1 tablet (500 mg total) by mouth 2 (two) times daily as needed for moderate pain.  ? Omega-3 Fatty Acids (FISH OIL) 1000 MG CAPS Take 1 capsule (1,000 mg total) by mouth daily.  ? sildenafil (REVATIO) 20 MG tablet Take 20-100 mg by mouth as needed (ED).  ? sodium chloride (OCEAN) 0.65 % SOLN nasal spray Place 1 spray into both nostrils 2 (two) times daily.  ? ? ? ?  12/22/2021  ?  9:58 AM 04/23/2021  ?  8:15 AM 04/09/2021  ? 10:07 AM 04/08/2020  ?  8:51 AM  ?GAD 7 : Generalized Anxiety Score  ?Nervous, Anxious, on Edge 0 0 0 0  ?Control/stop worrying 0 0 0 0  ?Worry too much - different things 0 0 0  0  ?Trouble relaxing 0 0 0 0  ?Restless 0 0 0 0  ?Easily annoyed or irritable 0 0 0 0  ?Afraid - awful might happen 0 0 0 0  ?Total GAD 7 Score 0 0 0 0  ?Anxiety Difficulty Not difficult at all Not difficult at all    ? ? ? ?  12/22/2021  ?  9:57 AM  ?Depression screen PHQ 2/9  ?Decreased Interest 0  ?Down, Depressed, Hopeless 0  ?PHQ - 2 Score 0  ?Altered sleeping 0  ?Tired, decreased energy 0  ?Change in appetite 0  ?Feeling bad or failure about yourself  0  ?Trouble concentrating 0  ?Moving slowly or fidgety/restless 0  ?Suicidal thoughts 0  ?PHQ-9 Score 0  ?Difficult doing work/chores Not difficult at all  ? ? ?BP Readings from Last 3 Encounters:  ?12/22/21 124/80  ?04/23/21 134/76  ?04/09/21 120/62  ? ? ?Physical Exam ?Vitals and nursing note reviewed.  ?HENT:  ?   Head: Normocephalic.  ?   Right Ear: Tympanic membrane and external ear normal. There is no impacted cerumen.  ?   Left Ear: Tympanic membrane and external ear normal. There is no impacted cerumen.  ?   Nose: Nose normal. No congestion or rhinorrhea.  ?Eyes:  ?   General: No scleral icterus.    ?   Right  eye: No discharge.     ?   Left eye: No discharge.  ?   Conjunctiva/sclera: Conjunctivae normal.  ?   Pupils: Pupils are equal, round, and reactive to light.  ?Neck:  ?   Thyroid: No thyromegaly.  ?   Vasc

## 2021-12-23 LAB — RENAL FUNCTION PANEL
Albumin: 4.2 g/dL (ref 3.7–4.7)
BUN/Creatinine Ratio: 18 (ref 10–24)
BUN: 17 mg/dL (ref 8–27)
CO2: 24 mmol/L (ref 20–29)
Calcium: 9 mg/dL (ref 8.6–10.2)
Chloride: 99 mmol/L (ref 96–106)
Creatinine, Ser: 0.96 mg/dL (ref 0.76–1.27)
Glucose: 83 mg/dL (ref 70–99)
Phosphorus: 3.3 mg/dL (ref 2.8–4.1)
Potassium: 4.6 mmol/L (ref 3.5–5.2)
Sodium: 139 mmol/L (ref 134–144)
eGFR: 83 mL/min/{1.73_m2} (ref >59)

## 2021-12-23 LAB — CBC WITH DIFFERENTIAL/PLATELET
Basophils Absolute: 0.1 10*3/uL (ref 0.0–0.2)
Basos: 1 %
EOS (ABSOLUTE): 0.3 10*3/uL (ref 0.0–0.4)
Eos: 6 %
Hematocrit: 40.5 % (ref 37.5–51.0)
Hemoglobin: 14.1 g/dL (ref 13.0–17.7)
Immature Grans (Abs): 0 10*3/uL (ref 0.0–0.1)
Immature Granulocytes: 0 %
Lymphocytes Absolute: 1.2 10*3/uL (ref 0.7–3.1)
Lymphs: 25 %
MCH: 33.4 pg — ABNORMAL HIGH (ref 26.6–33.0)
MCHC: 34.8 g/dL (ref 31.5–35.7)
MCV: 96 fL (ref 79–97)
Monocytes Absolute: 0.8 10*3/uL (ref 0.1–0.9)
Monocytes: 17 %
Neutrophils Absolute: 2.4 10*3/uL (ref 1.4–7.0)
Neutrophils: 51 %
Platelets: 226 10*3/uL (ref 150–450)
RBC: 4.22 x10E6/uL (ref 4.14–5.80)
RDW: 12.3 % (ref 11.6–15.4)
WBC: 4.7 10*3/uL (ref 3.4–10.8)

## 2021-12-23 LAB — THYROID PANEL WITH TSH
Free Thyroxine Index: 1.9 (ref 1.2–4.9)
T3 Uptake Ratio: 32 % (ref 24–39)
T4, Total: 5.9 ug/dL (ref 4.5–12.0)
TSH: 3.48 u[IU]/mL (ref 0.450–4.500)

## 2022-01-15 ENCOUNTER — Ambulatory Visit (LOCAL_COMMUNITY_HEALTH_CENTER): Payer: Medicare PPO

## 2022-01-15 DIAGNOSIS — Z23 Encounter for immunization: Secondary | ICD-10-CM

## 2022-01-15 DIAGNOSIS — Z719 Counseling, unspecified: Secondary | ICD-10-CM

## 2022-01-15 NOTE — Progress Notes (Signed)
  Are you feeling sick today? No   Have you ever received a dose of COVID-19 Vaccine? [employer], [employer], [place], [place], Other) Yes  If yes, which vaccine and how many doses?   5 doses Pfizer   Did you bring the vaccination record card or other documentation?  Yes   Do you have a health condition or are undergoing treatment that makes you moderately or severely immunocompromised? This would include, but not be limited to: cancer, HIV, organ transplant, immunosuppressive therapy/high-dose corticosteroids, or moderate/severe primary immunodeficiency.  No  Have you received COVID-19 vaccine before or during hematopoietic cell transplant (HCT) or CAR-T-cell therapies? No  Have you ever had an allergic reaction to: (This would include a severe allergic reaction or a reaction that caused hives, swelling, or respiratory distress, including wheezing.) A component of a COVID-19 vaccine or a previous dose of COVID-19 vaccine? No   Have you ever had an allergic reaction to another vaccine (other thanCOVID-19 vaccine) or an injectable medication? (This would include a severe allergic reaction or a reaction that caused hives, swelling, or respiratory distress, including wheezing.)   No    Do you have a history of any of the following:  Myocarditis or Pericarditis No  Dermal fillers:  No  Multisystem Inflammatory Syndrome (MIS-C or MIS-A)? No  COVID-19 disease within the past 3 months? No  Vaccinated with monkeypox vaccine in the last 4 weeks? No  Patient seen today in nurse's clinic for COVID booster.  Patient stated the last [place] vaccination was 04/25/2021.  This is the last vaccination in [place] and on the patient's card.  Entry of 12/19/2021 vaccination entered in Epic by PCP is incorrect.  Pfizer BV 12+ administered today in right deltoid.  Tolerated well. VIS offered and declined.  COVID card updated.  [facility] updated and copy provided to patient.

## 2022-04-14 ENCOUNTER — Encounter: Payer: Medicare PPO | Admitting: Family Medicine

## 2022-04-24 ENCOUNTER — Other Ambulatory Visit: Payer: Self-pay | Admitting: Family Medicine

## 2022-04-24 DIAGNOSIS — E039 Hypothyroidism, unspecified: Secondary | ICD-10-CM

## 2022-04-28 ENCOUNTER — Encounter: Payer: Medicare PPO | Admitting: Family Medicine

## 2022-05-12 ENCOUNTER — Ambulatory Visit (INDEPENDENT_AMBULATORY_CARE_PROVIDER_SITE_OTHER): Payer: Medicare PPO | Admitting: Family Medicine

## 2022-05-12 ENCOUNTER — Encounter: Payer: Self-pay | Admitting: Family Medicine

## 2022-05-12 VITALS — BP 130/80 | HR 60 | Ht 72.0 in | Wt 210.0 lb

## 2022-05-12 DIAGNOSIS — L309 Dermatitis, unspecified: Secondary | ICD-10-CM

## 2022-05-12 DIAGNOSIS — M25549 Pain in joints of unspecified hand: Secondary | ICD-10-CM

## 2022-05-12 DIAGNOSIS — Z1211 Encounter for screening for malignant neoplasm of colon: Secondary | ICD-10-CM | POA: Diagnosis not present

## 2022-05-12 DIAGNOSIS — N138 Other obstructive and reflux uropathy: Secondary | ICD-10-CM | POA: Diagnosis not present

## 2022-05-12 DIAGNOSIS — N401 Enlarged prostate with lower urinary tract symptoms: Secondary | ICD-10-CM

## 2022-05-12 DIAGNOSIS — Z0000 Encounter for general adult medical examination without abnormal findings: Secondary | ICD-10-CM | POA: Diagnosis not present

## 2022-05-12 LAB — HEMOCCULT GUIAC POC 1CARD (OFFICE)

## 2022-05-12 NOTE — Progress Notes (Signed)
Date:  05/12/2022   Name:  Richard Stafford   DOB:  03-17-1946   MRN:  [id number]   Chief Complaint: Annual Exam, Flu Vaccine, and Rash (Upper body is "more sensitive" breaking out in red bumps that itch. Has switched laundry detergent and started benadryl- not helping. )  Patient is a 76 year old  male who presents for a comprehensive physical exam. The patient reports the following problems: rash/BP. Health maintenance has been reviewed up to date    Rash This is a new problem. The current episode started more than 1 month ago. The problem has been gradually improving since onset. The affected locations include the torso, back and chest. The rash is characterized by itchiness. He was exposed to nothing. Pertinent negatives include no congestion, cough, diarrhea, fever, shortness of breath or sore throat. Past treatments include antihistamine. The treatment provided mild relief.    Lab Results  Component Value Date   NA 139 12/22/2021   K 4.6 12/22/2021   CO2 24 12/22/2021   GLUCOSE 83 12/22/2021   BUN 17 12/22/2021   CREATININE 0.96 12/22/2021   CALCIUM 9.0 12/22/2021   EGFR 83 12/22/2021   GFRNONAA 69 04/08/2020   Lab Results  Component Value Date   CHOL 171 04/09/2021   HDL 61 04/09/2021   LDLCALC 96 04/09/2021   TRIG 72 04/09/2021   CHOLHDL 3.8 04/04/2018   Lab Results  Component Value Date   TSH 3.480 12/22/2021   No results found for: "HGBA1C" Lab Results  Component Value Date   WBC 4.7 12/22/2021   HGB 14.1 12/22/2021   HCT 40.5 12/22/2021   MCV 96 12/22/2021   PLT 226 12/22/2021   Lab Results  Component Value Date   ALT 27 04/08/2020   AST 35 04/08/2020   ALKPHOS 85 04/08/2020   BILITOT 0.6 04/08/2020   No results found for: "25OHVITD2", "25OHVITD3", "VD25OH"   Review of Systems  Constitutional:  Negative for chills and fever.  HENT:  Negative for congestion, drooling, ear discharge, ear pain and sore throat.   Respiratory:  Negative for cough,  shortness of breath and wheezing.   Cardiovascular:  Negative for chest pain, palpitations and leg swelling.  Gastrointestinal:  Negative for abdominal pain, blood in stool, constipation, diarrhea and nausea.  Endocrine: Negative for polydipsia.  Genitourinary:  Negative for dysuria, frequency, hematuria and urgency.  Musculoskeletal:  Negative for back pain, myalgias and neck pain.  Skin:  Positive for rash.  Allergic/Immunologic: Negative for environmental allergies.  Neurological:  Negative for dizziness and headaches.  Hematological:  Does not bruise/bleed easily.  Psychiatric/Behavioral:  Negative for suicidal ideas. The patient is not nervous/anxious.     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   HLD (hyperlipidemia) 12/21/2014   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   Prostatic hyperplasia, benign localized, with obstruction 05/16/2014   Hx of adenomatous colonic polyps 01/19/2014  No Known Allergies  Past Surgical History:  Procedure Laterality Date   COLONOSCOPY  2014   cleared for 5 years- [place]   COLONOSCOPY WITH PROPOFOL N/A 04/11/2019   Procedure: COLONOSCOPY WITH PROPOFOL;  Surgeon: Lollie Sails, MD;  Location: [facility] ENDOSCOPY;  Service: Endoscopy;  Laterality: N/A;   CYST EXCISION     cyst in maxillary sinus removed   HERNIA REPAIR     INCISIONAL HERNIA REPAIR N/A  08/27/2020   Procedure: HERNIA REPAIR INCISIONAL;  Surgeon: Olean Ree, MD;  Location: [facility] ORS;  Service: General;  Laterality: N/A;   LAPAROSCOPIC INGUINAL HERNIA REPAIR Right    NASAL ENDOSCOPY Bilateral 01/07/2018   TOOTH EXTRACTION     varicose veins      Social History   Tobacco Use   Smoking status: Former    Packs/day: 1.00    Years: 4.00    Total pack years: 4.00    Types: Cigarettes    Quit date: 1988    Years since quitting: 35.7   Smokeless tobacco: Never  Vaping Use   Vaping Use: Never used  Substance Use Topics   Alcohol use: Yes    Alcohol/week: 13.0 standard drinks of alcohol    Types: 7 Glasses of wine, 6 Cans of beer per week   Drug use: Never     Medication list has been reviewed and updated.  Current Meds  Medication Sig   Ascorbic Acid (VITAMIN C) 500 MG CAPS Take 500 mg by mouth daily.   aspirin 81 MG tablet Take 81 mg by mouth daily.   calcium carbonate (TUMS - DOSED IN MG ELEMENTAL CALCIUM) 500 MG chewable tablet Chew 1 tablet by mouth daily as needed for indigestion or heartburn.   diphenhydrAMINE (BENADRYL) 25 MG tablet Take 25 mg by mouth every 6 (six) hours as needed. otc   docusate sodium (COLACE) 100 MG capsule Take 100 mg by mouth at bedtime.   finasteride (PROSCAR) 5 MG tablet Take 5 mg by mouth daily. Urology/ [facility]   Iron, Ferrous Sulfate, 325 (65 Fe) MG TABS Take 1 tablet by mouth daily.   levothyroxine (SYNTHROID) 25 MCG tablet TAKE 1 TABLET BY MOUTH AS DIRECTED ON TUESDAY, THURSDAY, SATURDAY, AND _0 /10/2021   10:41 AM 12/22/2021    9:58 AM 04/23/2021    8:15 AM 04/09/2021   10:07 AM  GAD 7 : Generalized Anxiety Score  Nervous, Anxious, on Edge 0 0 0 0  Control/stop worrying 0 0 0 0  Worry too much - different things 0 0 0 0  Trouble relaxing 0 0 0 0  Restless 0 0 0 0  Easily annoyed or irritable 0 0 0 0  Afraid - awful might happen 0 0 0 0  Total GAD 7 Score 0 0 0 0  Anxiety Difficulty Not difficult at all Not difficult at all Not  difficult at all        05/12/2022   10:40 AM 12/22/2021    9:57 AM 04/23/2021    8:14 AM  Depression screen PHQ 2/9  Decreased Interest 0 0 0  Down, Depressed, Hopeless 0 0 0  PHQ - 2 Score 0 0 0  Altered sleeping 0 0 0  Tired, decreased energy 0 0 0  Change in appetite 0 0 0  Feeling bad or failure about yourself  0 0 0  Trouble concentrating 0 0 0  Moving slowly or fidgety/restless 0 0 0  Suicidal thoughts 0 0 0  PHQ-9 Score 0 0 0  Difficult doing work/chores Not difficult at all Not difficult at all Not difficult at all    BP Readings from Last 3 Encounters:  05/12/22 130/80  12/22/21 124/80  04/23/21 134/76    Physical Exam Vitals and nursing note reviewed.  Constitutional:      Appearance: Normal appearance. He is overweight.  HENT:     Head: Normocephalic.     Jaw: There is normal jaw occlusion.     Right Ear: Hearing, tympanic membrane, ear canal and external ear normal.     Left Ear: Hearing, tympanic membrane, ear canal and external ear normal.     Nose: Nose normal. No congestion or rhinorrhea.     Mouth/Throat:     Lips: Pink.     Mouth: Mucous membranes are moist.     Dentition: Normal dentition.     Tongue: No lesions.     Palate: No mass.     Pharynx: Oropharynx is clear. No pharyngeal swelling, oropharyngeal exudate or posterior oropharyngeal erythema.     Tonsils: No tonsillar  exudate.  Eyes:     General: Lids are normal. Vision grossly intact. Gaze aligned appropriately. No visual field deficit or scleral icterus.       Right eye: No discharge.        Left eye: No discharge.     Extraocular Movements: Extraocular movements intact.     Conjunctiva/sclera: Conjunctivae normal.     Pupils: Pupils are equal, round, and reactive to light.     Funduscopic exam:    Right eye: Red reflex present.        Left eye: Red reflex present. Neck:     Thyroid: No thyroid mass, thyromegaly or thyroid tenderness.     Vascular: Normal carotid pulses. No carotid bruit, hepatojugular reflux or JVD.     Trachea: Trachea and phonation normal. No tracheal deviation.  Cardiovascular:     Rate and Rhythm: Normal rate and regular rhythm.     Chest Wall: PMI is not displaced. No thrill.     Pulses: Normal pulses.          Carotid pulses are 2+ on the right side and 2+ on the left side.      Radial pulses are 2+ on the right side and 2+ on the left side.       Femoral pulses are 2+ on the right side and 2+ on the left side.      Popliteal pulses are 2+ on the right side and 2+ on the left side.       Dorsalis pedis pulses are 2+ on the right side and 2+ on the left side.       Posterior tibial pulses are 2+ on the right side and 2+ on the left side.     Heart sounds: Normal heart sounds, S1 normal and  S2 normal. No murmur heard.    No systolic murmur is present.     No diastolic murmur is present.     No friction rub. No gallop. No S3 or S4 sounds.  Pulmonary:     Effort: No respiratory distress.     Breath sounds: Normal breath sounds. No decreased breath sounds, wheezing, rhonchi or rales.  Chest:  Breasts:    Right: Normal. No mass.     Left: Normal. No mass.  Abdominal:     General: Bowel sounds are normal.     Palpations: Abdomen is soft. There is no hepatomegaly, splenomegaly or mass.     Tenderness: There is no abdominal tenderness. There is no guarding or rebound.      Hernia: No hernia is present. There is no hernia in the umbilical area, ventral area, left inguinal area or right inguinal area.  Genitourinary:    Penis: Normal and circumcised.      Testes: Normal.  Musculoskeletal:        General: No tenderness. Normal range of motion.     Cervical back: Normal, full passive range of motion without pain, normal range of motion and neck supple.     Thoracic back: Normal.     Lumbar back: Normal.     Right lower leg: No edema.     Left lower leg: No edema.  Lymphadenopathy:     Cervical: No cervical adenopathy.     Right cervical: No superficial, deep or posterior cervical adenopathy.    Left cervical: No superficial, deep or posterior cervical adenopathy.     Upper Body:     Right upper body: No supraclavicular, axillary, pectoral or epitrochlear adenopathy.     Left upper body: No supraclavicular, axillary, pectoral or epitrochlear adenopathy.     Lower Body: No right inguinal adenopathy. No left inguinal adenopathy.  Skin:    General: Skin is warm.     Capillary Refill: Capillary refill takes less than 2 seconds.     Findings: No rash.  Neurological:     Mental Status: He is alert and oriented to person, place, and time.     Cranial Nerves: Cranial nerves 2-12 are intact. No cranial nerve deficit, dysarthria or facial asymmetry.     Sensory: Sensation is intact.     Motor: Motor function is intact.     Deep Tendon Reflexes: Reflexes are normal and symmetric.  Psychiatric:        Behavior: Behavior is cooperative.     Wt Readings from Last 3 Encounters:  05/12/22 210 lb (95.3 kg)  12/22/21 217 lb (98.4 kg)  04/23/21 213 lb (96.6 kg)    BP 130/80   Pulse 60   Ht 6' (1.829 m)   Wt 210 lb (95.3 kg)   BMI 28.48 kg/m   Assessment and Plan:  Richard Stafford is a 76 y.o. male who presents today for his Complete Annual Exam. He feels well. He reports exercising as tolerated. He reports he is sleeping fairly well.  Immunizations are  reviewed and recommendations provided.   Age appropriate screening tests are discussed. Counseling given for risk factor reduction interventions.  1. Annual physical exam No subjective/objective concerns noted during HPI, review of past medical history medications, review of systems, and physical exam.  We will obtain lab work including PSA lipid and CBC. - PSA - Lipid Panel With LDL/HDL Ratio - CBC with Differential/Platelet  2. Dermatitis New onset.  Persistent.  Primary torso of the  chest and.  It is pruritic.  We will initiate an over-the-counter antihistamines such as Claritin in the morning and Benadryl in the evening and will refer to dermatology.  This is a dermatitis and may be eczema. - Ambulatory referral to Dermatology  3. Metacarpophalangeal joint pain, unspecified laterality Patient has pain in the metacarpophalangeal joint of both toes which is not exquisitely tender with so is unlikely to be gout and is most likely abnormal arthritis nature.  4. Colon cancer screening DRE was normal and guaiac was negative. - POCT Occult Blood Stool  5. Prostatic hyperplasia, benign localized, with obstruction DRE was normal with prostate noted to be normal size shape and consistency.  We will check PSA for current level. - PSA  Otilio Miu, MD

## 2022-05-13 LAB — CBC WITH DIFFERENTIAL/PLATELET
Basophils Absolute: 0.1 10*3/uL (ref 0.0–0.2)
Basos: 1 %
EOS (ABSOLUTE): 0.3 10*3/uL (ref 0.0–0.4)
Eos: 4 %
Hematocrit: 43.4 % (ref 37.5–51.0)
Hemoglobin: 14.7 g/dL (ref 13.0–17.7)
Immature Grans (Abs): 0 10*3/uL (ref 0.0–0.1)
Immature Granulocytes: 0 %
Lymphocytes Absolute: 1.4 10*3/uL (ref 0.7–3.1)
Lymphs: 21 %
MCH: 33 pg (ref 26.6–33.0)
MCHC: 33.9 g/dL (ref 31.5–35.7)
MCV: 98 fL — ABNORMAL HIGH (ref 79–97)
Monocytes Absolute: 0.9 10*3/uL (ref 0.1–0.9)
Monocytes: 14 %
Neutrophils Absolute: 4.2 10*3/uL (ref 1.4–7.0)
Neutrophils: 60 %
Platelets: 316 10*3/uL (ref 150–450)
RBC: 4.45 x10E6/uL (ref 4.14–5.80)
RDW: 12.1 % (ref 11.6–15.4)
WBC: 6.8 10*3/uL (ref 3.4–10.8)

## 2022-05-13 LAB — LIPID PANEL WITH LDL/HDL RATIO
Cholesterol, Total: 144 mg/dL (ref 100–199)
HDL: 45 mg/dL (ref >39)
LDL Chol Calc (NIH): 84 mg/dL (ref 0–99)
LDL/HDL Ratio: 1.9 ratio (ref 0.0–3.6)
Triglycerides: 76 mg/dL (ref 0–149)
VLDL Cholesterol Cal: 15 mg/dL (ref 5–40)

## 2022-05-13 LAB — PSA: Prostate Specific Ag, Serum: 1.8 ng/mL (ref 0.0–4.0)

## 2022-05-25 DIAGNOSIS — D3132 Benign neoplasm of left choroid: Secondary | ICD-10-CM | POA: Diagnosis not present

## 2022-05-25 DIAGNOSIS — H524 Presbyopia: Secondary | ICD-10-CM | POA: Diagnosis not present

## 2022-05-26 DIAGNOSIS — R21 Rash and other nonspecific skin eruption: Secondary | ICD-10-CM | POA: Diagnosis not present

## 2022-05-26 DIAGNOSIS — L308 Other specified dermatitis: Secondary | ICD-10-CM | POA: Diagnosis not present

## 2022-06-02 DIAGNOSIS — N401 Enlarged prostate with lower urinary tract symptoms: Secondary | ICD-10-CM | POA: Diagnosis not present

## 2022-06-02 DIAGNOSIS — N138 Other obstructive and reflux uropathy: Secondary | ICD-10-CM | POA: Diagnosis not present

## 2022-06-02 DIAGNOSIS — N400 Enlarged prostate without lower urinary tract symptoms: Secondary | ICD-10-CM | POA: Diagnosis not present

## 2022-06-02 DIAGNOSIS — Z7982 Long term (current) use of aspirin: Secondary | ICD-10-CM | POA: Diagnosis not present

## 2022-06-02 DIAGNOSIS — R972 Elevated prostate specific antigen [PSA]: Secondary | ICD-10-CM | POA: Diagnosis not present

## 2022-06-04 ENCOUNTER — Telehealth: Payer: Self-pay | Admitting: Family Medicine

## 2022-06-04 NOTE — Telephone Encounter (Signed)
Patient declined the Medicare Wellness Visit with [facility]  - Hung up on me

## 2022-06-16 DIAGNOSIS — L2089 Other atopic dermatitis: Secondary | ICD-10-CM | POA: Diagnosis not present

## 2022-06-23 ENCOUNTER — Encounter: Payer: Self-pay | Admitting: Family Medicine

## 2022-06-23 ENCOUNTER — Ambulatory Visit: Payer: Medicare PPO | Admitting: Family Medicine

## 2022-06-23 VITALS — BP 120/62 | HR 60 | Ht 72.0 in | Wt 213.0 lb

## 2022-06-23 DIAGNOSIS — E039 Hypothyroidism, unspecified: Secondary | ICD-10-CM

## 2022-06-23 DIAGNOSIS — D509 Iron deficiency anemia, unspecified: Secondary | ICD-10-CM | POA: Diagnosis not present

## 2022-06-23 MED ORDER — LEVOTHYROXINE SODIUM 25 MCG PO TABS: ORAL_TABLET | ORAL | 1 refills | Status: DC

## 2022-06-23 MED ORDER — IRON (FERROUS SULFATE) 325 (65 FE) MG PO TABS: 1.0000 | ORAL_TABLET | Freq: Every day | ORAL | 1 refills | Status: DC

## 2022-06-23 MED ORDER — LEVOTHYROXINE SODIUM 50 MCG PO TABS: ORAL_TABLET | ORAL | 1 refills | Status: DC

## 2022-06-23 NOTE — Progress Notes (Signed)
Date:  06/23/2022   Name:  Richard Stafford   DOB:  Apr 27, 1946   MRN:  [id number]   Chief Complaint: Anemia and Hypothyroidism  Anemia Presents for follow-up visit. Symptoms include weight loss. There has been no abdominal pain, bruising/bleeding easily, confusion, fever, malaise/fatigue, pallor, palpitations or pica. Signs of blood loss that are not present include hematemesis, hematochezia and melena. There are no compliance problems.   Thyroid Problem Presents for follow-up visit. Symptoms include fatigue and weight loss. Patient reports no anxiety, cold intolerance, constipation, depressed mood, diarrhea, dry skin, hair loss, heat intolerance, hoarse voice, nail problem, palpitations, tremors, visual change or weight gain.    Lab Results  Component Value Date   NA 139 12/22/2021   K 4.6 12/22/2021   CO2 24 12/22/2021   GLUCOSE 83 12/22/2021   BUN 17 12/22/2021   CREATININE 0.96 12/22/2021   CALCIUM 9.0 12/22/2021   EGFR 83 12/22/2021   GFRNONAA 69 04/08/2020   Lab Results  Component Value Date   CHOL 144 05/12/2022   HDL 45 05/12/2022   LDLCALC 84 05/12/2022   TRIG 76 05/12/2022   CHOLHDL 3.8 04/04/2018   Lab Results  Component Value Date   TSH 3.480 12/22/2021   No results found for: "HGBA1C" Lab Results  Component Value Date   WBC 6.8 05/12/2022   HGB 14.7 05/12/2022   HCT 43.4 05/12/2022   MCV 98 (H) 05/12/2022   PLT 316 05/12/2022   Lab Results  Component Value Date   ALT 27 04/08/2020   AST 35 04/08/2020   ALKPHOS 85 04/08/2020   BILITOT 0.6 04/08/2020   No results found for: "25OHVITD2", "25OHVITD3", "VD25OH"   Review of Systems  Constitutional:  Positive for fatigue and weight loss. Negative for chills, fever, malaise/fatigue and weight gain.  HENT:  Negative for drooling, ear discharge, ear pain, hoarse voice and sore throat.   Respiratory:  Negative for cough, shortness of breath and wheezing.   Cardiovascular:  Negative for chest pain,  palpitations and leg swelling.  Gastrointestinal:  Negative for abdominal pain, blood in stool, constipation, diarrhea, hematemesis, hematochezia, melena and nausea.  Endocrine: Negative for cold intolerance, heat intolerance, polydipsia and polyuria.  Genitourinary:  Negative for difficulty urinating, dysuria, frequency, hematuria and urgency.  Musculoskeletal:  Negative for back pain, myalgias and neck pain.  Skin:  Negative for pallor and rash.  Allergic/Immunologic: Negative for environmental allergies.  Neurological:  Negative for dizziness, tremors and headaches.  Hematological:  Negative for adenopathy. Does not bruise/bleed easily.  Psychiatric/Behavioral:  Negative for confusion and suicidal ideas. The patient is not nervous/anxious.     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   HLD (hyperlipidemia) 12/21/2014   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   Prostatic hyperplasia, benign localized, with obstruction 05/16/2014   Hx of adenomatous colonic polyps 01/19/2014    No Known Allergies  Past Surgical History:  Procedure Laterality Date  COLONOSCOPY  2014   cleared for 5 years- [place]   COLONOSCOPY WITH PROPOFOL N/A 04/11/2019   Procedure: COLONOSCOPY WITH PROPOFOL;  Surgeon: Lollie Sails, MD;  Location: [facility]COPY;  Service: Endoscopy;  Laterality: N/A;   CYST EXCISION     cyst  in maxillary sinus removed   HERNIA REPAIR     INCISIONAL HERNIA REPAIR N/A 08/27/2020   Procedure: HERNIA REPAIR INCISIONAL;  Surgeon: Olean Ree, MD;  Location: [facility] ORS;  Service: General;  Laterality: N/A;   LAPAROSCOPIC INGUINAL HERNIA REPAIR Right    NASAL ENDOSCOPY Bilateral 01/07/2018   TOOTH EXTRACTION     varicose veins      Social History   Tobacco Use   Smoking status: Former    Packs/day: 1.00    Years: 4.00    Total pack years: 4.00    Types: Cigarettes    Quit date: 1988    Years since quitting: 35.8   Smokeless tobacco: Never  Vaping Use   Vaping Use: Never used  Substance Use Topics   Alcohol use: Yes    Alcohol/week: 13.0 standard drinks of alcohol    Types: 7 Glasses of wine, 6 Cans of beer per week   Drug use: Never     Medication list has been reviewed and updated.  Current Meds  Medication Sig   Ascorbic Acid (VITAMIN C) 500 MG CAPS Take 500 mg by mouth daily.   aspirin 81 MG tablet Take 81 mg by mouth daily.   calcium carbonate (TUMS - DOSED IN MG ELEMENTAL CALCIUM) 500 MG chewable tablet Chew 1 tablet by mouth daily as needed for indigestion or heartburn.   diphenhydrAMINE (BENADRYL) 25 MG tablet Take 25 mg by mouth every 6 (six) hours as needed. otc   docusate sodium (COLACE) 100 MG capsule Take 100 mg by mouth at bedtime.   finasteride (PROSCAR) 5 MG tablet Take 5 mg by mouth daily. Urology/ [facility]   Iron, Ferrous Sulfate, 325 (65 Fe) MG TABS Take 1 tablet by mouth daily.   levothyroxine (SYNTHROID) 25 MCG tablet TAKE 1 TABLET BY MOUTH AS DIRECTED ON TUESDAY, THURSDAY, SATURDAY, AND _0 /14/2023    9:46 AM 05/12/2022   10:41 AM 12/22/2021    9:58 AM 04/23/2021    8:15 AM  GAD 7 : Generalized Anxiety Score  Nervous, Anxious, on Edge 0 0 0 0  Control/stop worrying 0 0 0 0  Worry too much - different things 0 0 0 0  Trouble relaxing 0 0 0 0  Restless 0 0 0 0  Easily annoyed or irritable 0 0 0 0  Afraid - awful might happen 0 0 0 0  Total GAD 7 Score 0 0 0 0  Anxiety Difficulty Not difficult at all Not difficult at all Not difficult at all Not difficult at all  06/23/2022    9:46 AM 05/12/2022   10:40 AM 12/22/2021    9:57 AM  Depression screen PHQ 2/9  Decreased Interest 0 0 0  Down, Depressed, Hopeless 0 0 0  PHQ - 2 Score 0 0 0  Altered sleeping 0 0 0  Tired, decreased energy 0 0 0  Change in appetite 0 0 0  Feeling bad or failure about yourself  0 0 0  Trouble concentrating 0 0 0  Moving slowly or fidgety/restless 0 0 0  Suicidal thoughts 0 0 0  PHQ-9 Score 0 0 0  Difficult doing work/chores Not difficult at all Not difficult at all Not difficult at all    BP Readings from Last 3 Encounters:  06/23/22 120/62  05/12/22 130/80  12/22/21 124/80    Physical Exam HENT:     Head: Normocephalic.     Right Ear: Tympanic membrane normal.     Left Ear: Tympanic membrane normal.     Nose: Nose normal.     Mouth/Throat:     Mouth: Mucous membranes are moist.  Eyes:     Conjunctiva/sclera:     Right eye: Right conjunctiva is not injected.     Left eye: Left conjunctiva is not injected.  Neck:     Thyroid: No thyroid mass, thyromegaly or thyroid tenderness.  Cardiovascular:     Rate and Rhythm: Normal rate and regular rhythm.     Heart sounds: S1 normal and S2 normal. No murmur heard.    No friction rub. No gallop. No S3 or S4 sounds.  Pulmonary:      Effort: No respiratory distress.     Breath sounds: No stridor. No wheezing, rhonchi or rales.  Chest:     Chest wall: No tenderness.  Abdominal:     General: Abdomen is flat.  Musculoskeletal:     Cervical back: Normal range of motion and neck supple.  Lymphadenopathy:     Cervical: No cervical adenopathy.     Right cervical: No superficial, deep or posterior cervical adenopathy.    Left cervical: No superficial, deep or posterior cervical adenopathy.     Wt Readings from Last 3 Encounters:  06/23/22 213 lb (96.6 kg)  05/12/22 210 lb (95.3 kg)  12/22/21 217 lb (98.4 kg)    BP 120/62   Pulse 60   Ht 6' (1.829 m)   Wt 213 lb (96.6 kg)   SpO2 98%   BMI 28.89 kg/m   Assessment and Plan:  1. Iron deficiency anemia, unspecified iron deficiency anemia type Chronic.  Controlled.  Stable.  We will continue iron ferrous sulfate at current level. - Iron, Ferrous Sulfate, 325 (65 Fe) MG TABS; Take 1 tablet by mouth daily.  Dispense: 90 tablet; Refill: 1  2. Acquired hypothyroidism Chronic.  Controlled.  Stable.  On split dosing.  Patient's had little mild fatigue and some fluctuation in weight.  We will check TSH to see if thyroid is sufficiently supplemented. - levothyroxine (SYNTHROID) 25 MCG tablet; TAKE 1 TABLET BY MOUTH AS DIRECTED ON TUESDAY, THURSDAY, SATURDAY, AND SUNDAY  Dispense: 48 tablet; Refill: 1 - levothyroxine (SYNTHROID) 50 MCG tablet; TAKE 1 TABLET BY MOUTH ON MONDAY, WEDNESDAY, FRIDAY AS DIRECTED  Dispense: 36 tablet; Refill: 1 - TSH    Otilio Miu, MD

## 2022-06-24 ENCOUNTER — Ambulatory Visit: Payer: Medicare PPO | Admitting: Family Medicine

## 2022-06-24 LAB — TSH: TSH: 6.99 u[IU]/mL — ABNORMAL HIGH (ref 0.450–4.500)

## 2022-06-26 ENCOUNTER — Other Ambulatory Visit: Payer: Self-pay

## 2022-06-26 DIAGNOSIS — E039 Hypothyroidism, unspecified: Secondary | ICD-10-CM

## 2022-06-26 MED ORDER — LEVOTHYROXINE SODIUM 50 MCG PO TABS: ORAL_TABLET | ORAL | 1 refills | Status: DC

## 2022-06-26 MED ORDER — LEVOTHYROXINE SODIUM 25 MCG PO TABS: ORAL_TABLET | ORAL | 1 refills | Status: DC

## 2022-07-20 ENCOUNTER — Other Ambulatory Visit: Payer: Self-pay

## 2022-07-20 DIAGNOSIS — E039 Hypothyroidism, unspecified: Secondary | ICD-10-CM

## 2022-07-20 MED ORDER — LEVOTHYROXINE SODIUM 25 MCG PO TABS: ORAL_TABLET | ORAL | 1 refills | Status: DC

## 2022-07-20 MED ORDER — LEVOTHYROXINE SODIUM 50 MCG PO TABS: ORAL_TABLET | ORAL | 1 refills | Status: DC

## 2022-07-28 ENCOUNTER — Ambulatory Visit (LOCAL_COMMUNITY_HEALTH_CENTER): Payer: Medicare PPO

## 2022-07-28 DIAGNOSIS — Z719 Counseling, unspecified: Secondary | ICD-10-CM

## 2022-07-28 DIAGNOSIS — Z23 Encounter for immunization: Secondary | ICD-10-CM | POA: Diagnosis not present

## 2022-07-28 NOTE — Progress Notes (Signed)
  Are you feeling sick today? No   Have you ever received a dose of COVID-19 Vaccine? [employer], [employer], [place], [place], Other) Yes  If yes, which vaccine and how many doses?   PFIZER, 6   Did you bring the vaccination record card or other documentation?  Yes   Do you have a health condition or are undergoing treatment that makes you moderately or severely immunocompromised? This would include, but not be limited to: cancer, HIV, organ transplant, immunosuppressive therapy/high-dose corticosteroids, or moderate/severe primary immunodeficiency.  No  Have you received COVID-19 vaccine before or during hematopoietic cell transplant (HCT) or CAR-T-cell therapies? No  Have you ever had an allergic reaction to: (This would include a severe allergic reaction or a reaction that caused hives, swelling, or respiratory distress, including wheezing.) A component of a COVID-19 vaccine or a previous dose of COVID-19 vaccine? No   Have you ever had an allergic reaction to another vaccine (other thanCOVID-19 vaccine) or an injectable medication? (This would include a severe allergic reaction or a reaction that caused hives, swelling, or respiratory distress, including wheezing.)   No    Do you have a history of any of the following:  Myocarditis or Pericarditis No  Dermal fillers:  No  Multisystem Inflammatory Syndrome (MIS-C or MIS-A)? No  COVID-19 disease within the past 3 months? No  Vaccinated with monkeypox vaccine in the last 4 weeks? No  Eligible, administered Pfizer comirnaty [phone]. Monitored, tolerated well. Provided VIS and NCIR copy. M.Jan Olano, LPN.

## 2022-08-24 ENCOUNTER — Encounter: Payer: Self-pay | Admitting: Family Medicine

## 2022-08-24 ENCOUNTER — Ambulatory Visit: Payer: Medicare PPO | Admitting: Family Medicine

## 2022-08-24 VITALS — BP 120/70 | HR 52 | Ht 72.0 in | Wt 210.0 lb

## 2022-08-24 DIAGNOSIS — E034 Atrophy of thyroid (acquired): Secondary | ICD-10-CM | POA: Diagnosis not present

## 2022-08-24 NOTE — Progress Notes (Signed)
Date:  08/24/2022   Name:  Richard Stafford   DOB:  09/04/1945   MRN:  [id number]   Chief Complaint: Hypothyroidism  Thyroid Problem Presents for follow-up visit. Symptoms include fatigue. Patient reports no anxiety, cold intolerance, constipation, depressed mood, diaphoresis, diarrhea, dry skin, hair loss, heat intolerance, hoarse voice, nail problem, palpitations, tremors, visual change, weight gain or weight loss. The symptoms have been stable.    Lab Results  Component Value Date   NA 139 12/22/2021   K 4.6 12/22/2021   CO2 24 12/22/2021   GLUCOSE 83 12/22/2021   BUN 17 12/22/2021   CREATININE 0.96 12/22/2021   CALCIUM 9.0 12/22/2021   EGFR 83 12/22/2021   GFRNONAA 69 04/08/2020   Lab Results  Component Value Date   CHOL 144 05/12/2022   HDL 45 05/12/2022   LDLCALC 84 05/12/2022   TRIG 76 05/12/2022   CHOLHDL 3.8 04/04/2018   Lab Results  Component Value Date   TSH 6.990 (H) 06/23/2022   No results found for: "HGBA1C" Lab Results  Component Value Date   WBC 6.8 05/12/2022   HGB 14.7 05/12/2022   HCT 43.4 05/12/2022   MCV 98 (H) 05/12/2022   PLT 316 05/12/2022   Lab Results  Component Value Date   ALT 27 04/08/2020   AST 35 04/08/2020   ALKPHOS 85 04/08/2020   BILITOT 0.6 04/08/2020   No results found for: "25OHVITD2", "25OHVITD3", "VD25OH"   Review of Systems  Constitutional:  Positive for fatigue. Negative for chills, diaphoresis, weight gain and weight loss.  HENT:  Negative for hoarse voice and trouble swallowing.   Eyes:  Negative for visual disturbance.  Respiratory:  Negative for chest tightness, shortness of breath and wheezing.   Cardiovascular:  Negative for chest pain and palpitations.  Gastrointestinal:  Negative for constipation and diarrhea.  Endocrine: Negative for cold intolerance, heat intolerance, polyphagia and polyuria.  Neurological:  Negative for tremors.  Psychiatric/Behavioral:  The patient is not nervous/anxious.      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COPY;  Service: Endoscopy;  Laterality: N/A;   CYST EXCISION     cyst in maxillary sinus removed   HERNIA REPAIR     INCISIONAL HERNIA REPAIR N/A 08/27/2020   Procedure: HERNIA REPAIR INCISIONAL;  Surgeon: Olean Ree, MD;  Location: [facility] ORS;  Service: General;  Laterality: N/A;   LAPAROSCOPIC INGUINAL HERNIA REPAIR Right    NASAL ENDOSCOPY Bilateral 01/07/2018   TOOTH  EXTRACTION     varicose veins      Social History   Tobacco Use   Smoking status: Former    Packs/day: 1.00    Years: 4.00    Total pack years: 4.00    Types: Cigarettes    Quit date: 1988    Years since quitting: 36.0   Smokeless tobacco: Never  Vaping Use   Vaping Use: Never used  Substance Use Topics   Alcohol use: Yes    Alcohol/week: 13.0 standard drinks of alcohol    Types: 7 Glasses of wine, 6 Cans of beer per week   Drug  use: Never     Medication list has been reviewed and updated.  Current Meds  Medication Sig   Ascorbic Acid (VITAMIN C) 500 MG CAPS Take 500 mg by mouth daily.   aspirin 81 MG tablet Take 81 mg by mouth daily.   calcium carbonate (TUMS - DOSED IN MG ELEMENTAL CALCIUM) 500 MG chewable tablet Chew 1 tablet by mouth daily as needed for indigestion or heartburn.   diphenhydrAMINE (BENADRYL) 25 MG tablet Take 25 mg by mouth every 6 (six) hours as needed. otc   docusate sodium (COLACE) 100 MG capsule Take 100 mg by mouth at bedtime.   finasteride (PROSCAR) 5 MG tablet Take 5 mg by mouth daily. Urology/ [facility]   Iron, Ferrous Sulfate, 325 (65 Fe) MG TABS Take 1 tablet by mouth daily.   levothyroxine (SYNTHROID) 25 MCG tablet TAKE 1 TABLET BY MOUTH AS DIRECTED ON Monday, Wednesday and Friday   levothyroxine (SYNTHROID) 50 MCG tablet TAKE 1 TABLET BY MOUTH ON Tuesday, Thursday, Sat and Sun   Misc Natural Products (GLUCOSAMINE CHONDROITIN TRIPLE) TABS Take 1 tablet by mouth daily.   Multiple Vitamins-Minerals (MENS MULTIVITAMIN PLUS PO) Take 1 tablet by mouth daily.   Omega-3 Fatty Acids (FISH OIL) 1000 MG CAPS Take 1 capsule (1,000 mg total) by mouth daily.   sildenafil (REVATIO) 20 MG tablet Take 20-100 mg by mouth as needed (ED).   sodium chloride (OCEAN) 0.65 % SOLN nasal spray Place 1 spray into both nostrils 2 (two) times daily.       08/24/2022    8:31 AM 06/23/2022    9:46 AM 05/12/2022   10:41 AM 12/22/2021    9:58 AM  GAD 7 : Generalized Anxiety Score  Nervous, Anxious, on Edge 0 0 0 0  Control/stop worrying 0 0 0 0  Worry too much - different things 0 0 0 0  Trouble relaxing 0 0 0 0  Restless 0 0 0 0  Easily annoyed or irritable 0 0 0 0  Afraid - awful might happen 0 0 0 0  Total GAD 7 Score 0 0 0 0  Anxiety Difficulty Not difficult at all Not difficult at all Not difficult at all Not difficult at all       08/24/2022    8:30 AM 06/23/2022    9:46 AM 05/12/2022   10:40 AM   Depression screen PHQ 2/9  Decreased Interest 0 0 0  Down, Depressed, Hopeless 0 0 0  PHQ - 2 Score 0 0 0  Altered sleeping 0 0 0  Tired, decreased energy 0 0 0  Change in appetite 0 0 0  Feeling bad or failure about yourself  0 0 0  Trouble concentrating 0 0 0  Moving slowly or fidgety/restless 0 0 0  Suicidal thoughts 0 0 0  PHQ-9 Score 0 0 0  Difficult doing work/chores  Not difficult at all Not difficult at all Not difficult at all    BP Readings from Last 3 Encounters:  08/24/22 120/70  06/23/22 120/62  05/12/22 130/80    Physical Exam Vitals and nursing note reviewed.  HENT:     Head: Normocephalic.     Right Ear: Tympanic membrane and ear canal normal.     Left Ear: Tympanic membrane normal.     Nose: Nose normal.     Mouth/Throat:     Mouth: Mucous membranes are moist.  Eyes:     Pupils: Pupils are equal, round, and reactive to light.  Neck:     Thyroid: No thyroid mass, thyromegaly or thyroid tenderness.  Pulmonary:     Breath sounds: No decreased breath sounds, wheezing, rhonchi or rales.  Chest:     Chest wall: No mass.  Abdominal:     General: Bowel sounds are normal.     Palpations: Abdomen is rigid. There is no hepatomegaly or splenomegaly.     Tenderness: There is no abdominal tenderness.  Musculoskeletal:     Cervical back: Neck supple.  Lymphadenopathy:     Head:     Right side of head: No submental, submandibular or tonsillar adenopathy.     Left side of head: No submental, submandibular or tonsillar adenopathy.     Cervical: No cervical adenopathy.  Neurological:     Mental Status: He is alert.     Wt Readings from Last 3 Encounters:  08/24/22 210 lb (95.3 kg)  06/23/22 213 lb (96.6 kg)  05/12/22 210 lb (95.3 kg)    BP 120/70   Pulse (!) 52   Ht 6' (1.829 m)   Wt 210 lb (95.3 kg)   SpO2 96%   BMI 28.48 kg/m   Assessment and Plan:  1. 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  Controlled.  Stable.   Symptomatically doing well except for the fatigue which is controllable.  Patient is on a combination of Synthroid of 25 mcg and 50 mcg pending the day.  Will check TSH for current level of control and will recheck patient in 6 months. - TSH    Otilio Miu, MD

## 2022-08-25 LAB — TSH: TSH: 4.61 u[IU]/mL — ABNORMAL HIGH (ref 0.450–4.500)

## 2022-11-03 ENCOUNTER — Telehealth: Payer: Self-pay

## 2022-11-03 NOTE — Telephone Encounter (Signed)
Patient declined answering medicare wellness questions.  Richard Stafford

## 2022-12-07 ENCOUNTER — Ambulatory Visit
Admission: RE | Admit: 2022-12-07 | Discharge: 2022-12-07 | Disposition: A | Discharge status: Home / Self Care | Payer: Medicare PPO | Source: Ambulatory Visit | Attending: Family Medicine | Admitting: Family Medicine

## 2022-12-07 VITALS — BP 153/74 | HR 52 | Temp 98.2°F | Resp 16 | Ht 72.0 in | Wt 210.0 lb

## 2022-12-07 DIAGNOSIS — S41111A Laceration without foreign body of right upper arm, initial encounter: Secondary | ICD-10-CM

## 2022-12-07 MED ORDER — DOXYCYCLINE HYCLATE 100 MG PO CAPS: 100.0000 mg | ORAL_CAPSULE | Freq: Two times a day (BID) | ORAL | 0 refills | Status: DC
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
## 2022-12-07 NOTE — Discharge Instructions (Addendum)
Unfortunately, it is not recommended to close your skin after 18 hours of injury.  To prevent infection, Stop by the pharmacy to pick up your prescriptions.  Follow up with your primary care provider as needed.  Clean daily with the cleaning solution as discussed and recover.   Avoid getting the wound wet for the next week or until you see a new skin barrier.
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
## 2022-12-15 DIAGNOSIS — L2089 Other atopic dermatitis: Secondary | ICD-10-CM | POA: Diagnosis not present

## 2022-12-22 ENCOUNTER — Ambulatory Visit: Payer: Medicare PPO | Admitting: Family Medicine

## 2022-12-22 ENCOUNTER — Encounter: Payer: Self-pay | Admitting: Family Medicine

## 2022-12-22 VITALS — BP 120/62 | HR 64 | Ht 72.0 in | Wt 213.0 lb

## 2022-12-22 DIAGNOSIS — E039 Hypothyroidism, unspecified: Secondary | ICD-10-CM | POA: Diagnosis not present

## 2022-12-22 DIAGNOSIS — D509 Iron deficiency anemia, unspecified: Secondary | ICD-10-CM

## 2022-12-22 DIAGNOSIS — Z23 Encounter for immunization: Secondary | ICD-10-CM | POA: Diagnosis not present

## 2022-12-22 MED ORDER — PNEUMOCOCCAL 20-VAL CONJ VACC 0.5 ML IM SUSY: 0.5000 mL | PREFILLED_SYRINGE | INTRAMUSCULAR | 0 refills | Status: AC

## 2022-12-22 MED ORDER — LEVOTHYROXINE SODIUM 50 MCG PO TABS: ORAL_TABLET | ORAL | 1 refills | Status: DC

## 2022-12-22 MED ORDER — LEVOTHYROXINE SODIUM 25 MCG PO TABS: ORAL_TABLET | ORAL | 1 refills | Status: DC

## 2022-12-22 MED ORDER — IRON (FERROUS SULFATE) 325 (65 FE) MG PO TABS: 1.0000 | ORAL_TABLET | Freq: Every day | ORAL | 1 refills | Status: DC

## 2022-12-22 NOTE — Progress Notes (Signed)
Date:  12/22/2022   Name:  Richard Stafford   DOB:  10-Jul-1946   MRN:  [id number]   Chief Complaint: Hypothyroidism, Anemia, and pneumonia vaccine  Anemia Presents for follow-up visit. There has been no abdominal pain, bruising/bleeding easily, fever, palpitations or weight loss. Signs of blood loss that are not present include hematemesis, hematochezia, melena and menorrhagia. There are no compliance problems.   Thyroid Problem Presents for follow-up visit. Symptoms include constipation and fatigue. Patient reports no anxiety, cold intolerance, depressed mood, diarrhea, dry skin, heat intolerance, hoarse voice, leg swelling, nail problem, palpitations, weight gain or weight loss. The symptoms have been stable.    Lab Results  Component Value Date   NA 139 12/22/2021   K 4.6 12/22/2021   CO2 24 12/22/2021   GLUCOSE 83 12/22/2021   BUN 17 12/22/2021   CREATININE 0.96 12/22/2021   CALCIUM 9.0 12/22/2021   EGFR 83 12/22/2021   GFRNONAA 69 04/08/2020   Lab Results  Component Value Date   CHOL 144 05/12/2022   HDL 45 05/12/2022   LDLCALC 84 05/12/2022   TRIG 76 05/12/2022   CHOLHDL 3.8 04/04/2018   Lab Results  Component Value Date   TSH 4.610 (H) 08/24/2022   No results found for: "HGBA1C" Lab Results  Component Value Date   WBC 6.8 05/12/2022   HGB 14.7 05/12/2022   HCT 43.4 05/12/2022   MCV 98 (H) 05/12/2022   PLT 316 05/12/2022   Lab Results  Component Value Date   ALT 27 04/08/2020   AST 35 04/08/2020   ALKPHOS 85 04/08/2020   BILITOT 0.6 04/08/2020   No results found for: "25OHVITD2", "25OHVITD3", "VD25OH"   Review of Systems  Constitutional:  Positive for fatigue. Negative for fever, weight gain and weight loss.  HENT:  Negative for hoarse voice and trouble swallowing.   Respiratory:  Negative for cough, chest tightness and shortness of breath.   Cardiovascular:  Negative for chest pain and palpitations.  Gastrointestinal:  Positive for constipation.  Negative for abdominal pain, blood in stool, diarrhea, hematemesis, hematochezia and melena.  Endocrine: Negative for cold intolerance, heat intolerance, polydipsia and polyuria.  Genitourinary:  Negative for menorrhagia.  Hematological:  Does not bruise/bleed easily.  Psychiatric/Behavioral:  The patient is not nervous/anxious.     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   HLD (hyperlipidemia) 12/21/2014   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   Prostatic hyperplasia, benign localized, with obstruction 05/16/2014   Hx of adenomatous colonic polyps 01/19/2014    No Known Allergies  Past Surgical History:  Procedure Laterality Date   COLONOSCOPY  2014   cleared for 5 years- [facility]   COLONOSCOPY WITH PROPOFOL N/A 04/11/2019   Procedure: COLONOSCOPY WITH PROPOFOL;  Surgeon: Christena Deem, MD;  Location: [facility] ENDOSCOPY;  Service: Endoscopy;  Laterality: N/A;   CYST EXCISION     cyst in maxillary sinus removed  HERNIA REPAIR     INCISIONAL HERNIA REPAIR N/A 08/27/2020   Procedure: HERNIA REPAIR INCISIONAL;  Surgeon: Henrene Dodge, MD;  Location: [facility] ORS;  Service: General;  Laterality: N/A;   LAPAROSCOPIC INGUINAL HERNIA REPAIR Right    NASAL ENDOSCOPY Bilateral 01/07/2018   TOOTH EXTRACTION     varicose veins      Social History   Tobacco Use   Smoking status: Former    Packs/day: 1.00     Years: 4.00    Additional pack years: 0.00    Total pack years: 4.00    Types: Cigarettes    Quit date: 68    Years since quitting: 36.3   Smokeless tobacco: Never  Vaping Use   Vaping Use: Never used  Substance Use Topics   Alcohol use: Yes    Alcohol/week: 13.0 standard drinks of alcohol    Types: 7 Glasses of wine, 6 Cans of beer per week   Drug use: Never     Medication list has been reviewed and updated.  Current Meds  Medication Sig   Ascorbic Acid (VITAMIN C) 500 MG CAPS Take 500 mg by mouth daily.   aspirin 81 MG tablet Take 81 mg by mouth daily.   calcium carbonate (TUMS - DOSED IN MG ELEMENTAL CALCIUM) 500 MG chewable tablet Chew 1 tablet by mouth daily as needed for indigestion or heartburn.   diphenhydrAMINE (BENADRYL) 25 MG tablet Take 25 mg by mouth every 6 (six) hours as needed. otc   docusate sodium (COLACE) 100 MG capsule Take 100 mg by mouth at bedtime.   finasteride (PROSCAR) 5 MG tablet Take 5 mg by mouth daily. Urology/ [facility]   Iron, Ferrous Sulfate, 325 (65 Fe) MG TABS Take 1 tablet by mouth daily.   levothyroxine (SYNTHROID) 25 MCG tablet TAKE 1 TABLET BY MOUTH AS DIRECTED ON Monday, Wednesday and Friday   levothyroxine (SYNTHROID) 50 MCG tablet TAKE 1 TABLET BY MOUTH ON Tuesday, Thursday, Sat and Sun   Misc Natural Products (GLUCOSAMINE CHONDROITIN TRIPLE) TABS Take 1 tablet by mouth daily.   Multiple Vitamins-Minerals (MENS MULTIVITAMIN PLUS PO) Take 1 tablet by mouth daily.   Omega-3 Fatty Acids (FISH OIL) 1000 MG CAPS Take 1 capsule (1,000 mg total) by mouth daily.   sildenafil (REVATIO) 20 MG tablet Take 20-100 mg by mouth as needed (ED).   sodium chloride (OCEAN) 0.65 % SOLN nasal spray Place 1 spray into both nostrils 2 (two) times daily.       12/22/2022    9:48 AM 08/24/2022    8:31 AM 06/23/2022    9:46 AM 05/12/2022   10:41 AM  GAD 7 : Generalized Anxiety Score  Nervous, Anxious, on Edge 0 0 0 0  Control/stop worrying 0 0 0 0  Worry  too much - different things 0 0 0 0  Trouble relaxing 0 0 0 0  Restless 0 0 0 0  Easily annoyed or irritable 0 0 0 0  Afraid - awful might happen 0 0 0 0  Total GAD 7 Score 0 0 0 0  Anxiety Difficulty Not difficult at all Not difficult at all Not difficult at all Not difficult at all       12/22/2022    9:47 AM 08/24/2022    8:30 AM 06/23/2022    9:46 AM  Depression screen PHQ 2/9  Decreased Interest 0 0 0  Down, Depressed, Hopeless 0 0 0  PHQ - 2 Score 0 0 0  Altered sleeping 0  0 0  Tired, decreased energy 0 0 0  Change in appetite 0 0 0  Feeling bad or failure about yourself  0 0 0  Trouble concentrating 0 0 0  Moving slowly or fidgety/restless 0 0 0  Suicidal thoughts 0 0 0  PHQ-9 Score 0 0 0  Difficult doing work/chores Not difficult at all Not difficult at all Not difficult at all    BP Readings from Last 3 Encounters:  12/22/22 120/62  12/07/22 (!) 153/74  08/24/22 120/70    Physical Exam Vitals and nursing note reviewed.  HENT:     Head: Normocephalic.     Right Ear: Tympanic membrane and ear canal normal. There is no impacted cerumen.     Left Ear: Tympanic membrane and ear canal normal. There is no impacted cerumen.     Nose: Nose normal. No congestion or rhinorrhea.     Mouth/Throat:     Mouth: Mucous membranes are moist.     Pharynx: No oropharyngeal exudate or posterior oropharyngeal erythema.  Eyes:     Conjunctiva/sclera: Conjunctivae normal.  Cardiovascular:     Rate and Rhythm: Normal rate and regular rhythm.     Heart sounds: No murmur heard.    No friction rub. No gallop.  Pulmonary:     Effort: No respiratory distress.     Breath sounds: No wheezing, rhonchi or rales.  Abdominal:     Palpations: There is no hepatomegaly, splenomegaly or mass.     Tenderness: There is no abdominal tenderness.  Musculoskeletal:     Cervical back: Neck supple.     Wt Readings from Last 3 Encounters:  12/22/22 213 lb (96.6 kg)  12/07/22 210 lb (95.3 kg)   08/24/22 210 lb (95.3 kg)    BP 120/62   Pulse 64   Ht 6' (1.829 m)   Wt 213 lb (96.6 kg)   SpO2 97%   BMI 28.89 kg/m   Assessment and Plan:  1. Iron deficiency anemia, unspecified iron deficiency anemia type Chronic.  Controlled.  Stable.  Temporarily off following doxycycline.  Will continue on ferrous sulfate 325 mg daily.  Will check CMP for electrolytes and hepatic concerns and CBC for current hemoglobin stabilization. - Iron, Ferrous Sulfate, 325 (65 Fe) MG TABS; Take 1 tablet by mouth daily.  Dispense: 90 tablet; Refill: 1 - Comprehensive Metabolic Panel (CMET) - CBC w/Diff/Platelet  2. Acquired hypothyroidism Chronic.  Controlled.  Stable.  On a split dosing of levothyroxine 25 mcg on Monday Wednesday Friday and 50 mg on Thursday Saturday Sunday Tuesday.  Will check thyroid panel with TSH. - levothyroxine (SYNTHROID) 25 MCG tablet; TAKE 1 TABLET BY MOUTH AS DIRECTED ON Monday, Wednesday and Friday  Dispense: 36 tablet; Refill: 1 - levothyroxine (SYNTHROID) 50 MCG tablet; TAKE 1 TABLET BY MOUTH ON Tuesday, Thursday, Sat and Sun  Dispense: 48 tablet; Refill: 1 - Thyroid Panel With TSH  3. Pneumococcal vaccination given Discussed and administered - pneumococcal 20-valent conjugate vaccine (PREVNAR 20) 0.5 ML injection; Inject 0.5 mLs into the muscle tomorrow at 10 am for 1 dose.  Dispense: 0.5 mL; Refill: 0   Patient had a tick that was removed recently from the left axillary area which was examined by magnification and there was no residual tick left behind.  Mild erythema and he is going to use antibiotic ointment that he is currently using on his laceration. Elizabeth Sauer, MD

## 2022-12-22 NOTE — Addendum Note (Signed)
Addended by: Everitt Amber on: 12/22/2022 10:25 AM   Modules accepted: Orders, Level of Service

## 2022-12-23 LAB — CBC WITH DIFFERENTIAL/PLATELET
Basophils Absolute: 0 10*3/uL (ref 0.0–0.2)
Basos: 1 %
EOS (ABSOLUTE): 0.3 10*3/uL (ref 0.0–0.4)
Eos: 6 %
Hematocrit: 43.2 % (ref 37.5–51.0)
Hemoglobin: 14.6 g/dL (ref 13.0–17.7)
Immature Grans (Abs): 0 10*3/uL (ref 0.0–0.1)
Immature Granulocytes: 0 %
Lymphocytes Absolute: 1.4 10*3/uL (ref 0.7–3.1)
Lymphs: 29 %
MCH: 32.3 pg (ref 26.6–33.0)
MCHC: 33.8 g/dL (ref 31.5–35.7)
MCV: 96 fL (ref 79–97)
Monocytes Absolute: 0.8 10*3/uL (ref 0.1–0.9)
Monocytes: 15 %
Neutrophils Absolute: 2.4 10*3/uL (ref 1.4–7.0)
Neutrophils: 49 %
Platelets: 247 10*3/uL (ref 150–450)
RBC: 4.52 x10E6/uL (ref 4.14–5.80)
RDW: 12.3 % (ref 11.6–15.4)
WBC: 4.9 10*3/uL (ref 3.4–10.8)

## 2022-12-23 LAB — THYROID PANEL WITH TSH
Free Thyroxine Index: 2.2 (ref 1.2–4.9)
T3 Uptake Ratio: 33 % (ref 24–39)
T4, Total: 6.6 ug/dL (ref 4.5–12.0)
TSH: 4.6 u[IU]/mL — ABNORMAL HIGH (ref 0.450–4.500)

## 2022-12-23 LAB — COMPREHENSIVE METABOLIC PANEL
ALT: 23 IU/L (ref 0–44)
AST: 31 IU/L (ref 0–40)
Albumin/Globulin Ratio: 1.6 (ref 1.2–2.2)
Albumin: 4.1 g/dL (ref 3.8–4.8)
Alkaline Phosphatase: 73 IU/L (ref 44–121)
BUN/Creatinine Ratio: 14 (ref 10–24)
BUN: 16 mg/dL (ref 8–27)
Bilirubin Total: 0.6 mg/dL (ref 0.0–1.2)
CO2: 25 mmol/L (ref 20–29)
Calcium: 9 mg/dL (ref 8.6–10.2)
Chloride: 99 mmol/L (ref 96–106)
Creatinine, Ser: 1.11 mg/dL (ref 0.76–1.27)
Globulin, Total: 2.6 g/dL (ref 1.5–4.5)
Glucose: 95 mg/dL (ref 70–99)
Potassium: 4.6 mmol/L (ref 3.5–5.2)
Sodium: 138 mmol/L (ref 134–144)
Total Protein: 6.7 g/dL (ref 6.0–8.5)
eGFR: 69 mL/min/{1.73_m2} (ref >59)

## 2023-04-01 ENCOUNTER — Ambulatory Visit: Payer: Medicare PPO

## 2023-04-15 ENCOUNTER — Ambulatory Visit: Payer: Medicare PPO

## 2023-05-18 ENCOUNTER — Encounter: Payer: Self-pay | Admitting: Family Medicine

## 2023-05-18 ENCOUNTER — Ambulatory Visit (INDEPENDENT_AMBULATORY_CARE_PROVIDER_SITE_OTHER): Payer: Medicare PPO | Admitting: Family Medicine

## 2023-05-18 VITALS — BP 112/68 | HR 48 | Ht 72.0 in | Wt 212.8 lb

## 2023-05-18 DIAGNOSIS — Z0000 Encounter for general adult medical examination without abnormal findings: Secondary | ICD-10-CM

## 2023-05-18 NOTE — Progress Notes (Signed)
Date:  05/18/2023   Name:  Richard Stafford   DOB:  1945/12/15   MRN:  [id number]   Chief Complaint: Annual Exam  Patient is a 77 year old male who presents for a comprehensive physical exam. The patient reports the following problems: knee pain right/skin lesion. Health maintenance has been reviewed up to date.      Lab Results  Component Value Date   NA 138 12/22/2022   K 4.6 12/22/2022   CO2 25 12/22/2022   GLUCOSE 95 12/22/2022   BUN 16 12/22/2022   CREATININE 1.11 12/22/2022   CALCIUM 9.0 12/22/2022   EGFR 69 12/22/2022   GFRNONAA 69 04/08/2020   Lab Results  Component Value Date   CHOL 144 05/12/2022   HDL 45 05/12/2022   LDLCALC 84 05/12/2022   TRIG 76 05/12/2022   CHOLHDL 3.8 04/04/2018   Lab Results  Component Value Date   TSH 4.600 (H) 12/22/2022   No results found for: "HGBA1C" Lab Results  Component Value Date   WBC 4.9 12/22/2022   HGB 14.6 12/22/2022   HCT 43.2 12/22/2022   MCV 96 12/22/2022   PLT 247 12/22/2022   Lab Results  Component Value Date   ALT 23 12/22/2022   AST 31 12/22/2022   ALKPHOS 73 12/22/2022   BILITOT 0.6 12/22/2022   No results found for: "25OHVITD2", "25OHVITD3", "VD25OH"   Review of Systems  Constitutional:  Negative for chills and fever.  HENT:  Negative for drooling, ear discharge, ear pain and sore throat.   Respiratory:  Negative for cough, shortness of breath and wheezing.   Cardiovascular:  Negative for chest pain, palpitations and leg swelling.  Gastrointestinal:  Negative for abdominal pain, blood in stool, constipation, diarrhea and nausea.  Endocrine: Negative for polydipsia.  Genitourinary:  Negative for dysuria, frequency, hematuria and urgency.  Musculoskeletal:  Positive for arthralgias. Negative for back pain, myalgias and neck pain.  Skin:  Negative for rash.       Right neck nevus  Allergic/Immunologic: Negative for environmental allergies.  Neurological:  Negative for dizziness and headaches.   Hematological:  Does not bruise/bleed easily.  Psychiatric/Behavioral:  Negative for suicidal ideas. The patient is not nervous/anxious.     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   HLD (hyperlipidemia) 12/21/2014   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   Prostatic hyperplasia, benign localized, with obstruction 05/16/2014   Hx of adenomatous colonic polyps 01/19/2014    No Known Allergies  Past Surgical History:  Procedure Laterality Date   COLONOSCOPY  2014   cleared for 5 years- [facility]   COLONOSCOPY WITH PROPOFOL N/A 04/11/2019   Procedure: COLONOSCOPY WITH PROPOFOL;  Surgeon: Christena Deem, MD;  Location: [facility] ENDOSCOPY;  Service: Endoscopy;  Laterality: N/A;   CYST EXCISION     cyst in maxillary sinus removed   HERNIA REPAIR     INCISIONAL HERNIA REPAIR N/A 08/27/2020  Procedure: HERNIA REPAIR INCISIONAL;  Surgeon: Henrene Dodge, MD;  Location: [facility] ORS;  Service: General;  Laterality: N/A;   LAPAROSCOPIC INGUINAL HERNIA REPAIR Right    NASAL ENDOSCOPY Bilateral 01/07/2018   TOOTH EXTRACTION     varicose veins      Social History   Tobacco Use   Smoking status: Former    Current packs/day: 0.00    Average packs/day: 1 pack/day for 4.0 years (4.0 ttl pk-yrs)    Types: Cigarettes    Start date: 49    Quit date: 1988    Years since quitting: 36.7   Smokeless tobacco: Never   Vaping Use   Vaping status: Never Used  Substance Use Topics   Alcohol use: Yes    Alcohol/week: 13.0 standard drinks of alcohol    Types: 7 Glasses of wine, 6 Cans of beer per week   Drug use: Never     Medication list has been reviewed and updated.  Current Meds  Medication Sig   Ascorbic Acid (VITAMIN C) 500 MG CAPS Take 500 mg by mouth daily.   aspirin 81 MG tablet Take 81 mg by mouth daily.   calcium carbonate (TUMS - DOSED IN MG ELEMENTAL CALCIUM) 500 MG chewable tablet Chew 1 tablet by mouth daily as needed for indigestion or heartburn.   diphenhydrAMINE (BENADRYL) 25 MG tablet Take 25 mg by mouth every 6 (six) hours as needed. otc   docusate sodium (COLACE) 100 MG capsule Take 100 mg by mouth at bedtime.   finasteride (PROSCAR) 5 MG tablet Take 5 mg by mouth daily. Urology/ [facility]   Iron, Ferrous Sulfate, 325 (65 Fe) MG TABS Take 1 tablet by mouth daily.   levothyroxine (SYNTHROID) 25 MCG tablet TAKE 1 TABLET BY MOUTH AS DIRECTED ON Monday, Wednesday and Friday   levothyroxine (SYNTHROID) 50 MCG tablet TAKE 1 TABLET BY MOUTH ON Tuesday, Thursday, Sat and Sun   Misc Natural Products (GLUCOSAMINE CHONDROITIN TRIPLE) TABS Take 1 tablet by mouth daily.   Multiple Vitamins-Minerals (MENS MULTIVITAMIN PLUS PO) Take 1 tablet by mouth daily.   Omega-3 Fatty Acids (FISH OIL) 1000 MG CAPS Take 1 capsule (1,000 mg total) by mouth daily.   sildenafil (REVATIO) 20 MG tablet Take 20-100 mg by mouth as needed (ED).   sodium chloride (OCEAN) 0.65 % SOLN nasal spray Place 1 spray into both nostrils 2 (two) times daily.       05/18/2023    9:37 AM 12/22/2022    9:48 AM 08/24/2022    8:31 AM 06/23/2022    9:46 AM  GAD 7 : Generalized Anxiety Score  Nervous, Anxious, on Edge 0 0 0 0  Control/stop worrying 0 0 0 0  Worry too much - different things 0 0 0 0  Trouble relaxing 0 0 0 0  Restless 0 0 0 0  Easily annoyed or irritable 0 0 0 0  Afraid - awful might happen 0 0 0 0  Total GAD 7  Score 0 0 0 0  Anxiety Difficulty Not difficult at all Not difficult at all Not difficult at all Not difficult at all       05/18/2023    9:37 AM 12/22/2022    9:47 AM 08/24/2022    8:30 AM  Depression screen PHQ 2/9  Decreased Interest 0 0 0  Down, Depressed, Hopeless 0 0 0  PHQ - 2 Score 0 0 0  Altered sleeping 1 0 0  Tired, decreased energy 0 0 0  Change  in appetite 0 0 0  Feeling bad or failure about yourself  0 0 0  Trouble concentrating 0 0 0  Moving slowly or fidgety/restless 0 0 0  Suicidal thoughts 0 0 0  PHQ-9 Score 1 0 0  Difficult doing work/chores Not difficult at all Not difficult at all Not difficult at all    BP Readings from Last 3 Encounters:  05/18/23 112/68  12/22/22 120/62  12/07/22 (!) 153/74    Physical Exam Vitals and nursing note reviewed.  Constitutional:      Appearance: Normal appearance. He is well-groomed.  HENT:     Head: Normocephalic.     Jaw: There is normal jaw occlusion.     Right Ear: Hearing, tympanic membrane, ear canal and external ear normal.     Left Ear: Hearing, tympanic membrane, ear canal and external ear normal.     Nose: Nose normal.     Mouth/Throat:     Lips: Pink.     Mouth: Mucous membranes are moist.     Dentition: Normal dentition.     Tongue: No lesions.     Pharynx: Oropharynx is clear. Uvula midline. No postnasal drip.  Eyes:     General: Lids are normal. Vision grossly intact. Gaze aligned appropriately. No scleral icterus.       Right eye: No discharge.        Left eye: No discharge.     Conjunctiva/sclera: Conjunctivae normal.     Pupils: Pupils are equal, round, and reactive to light.  Neck:     Thyroid: No thyroid mass, thyromegaly or thyroid tenderness.     Vascular: Normal carotid pulses. No carotid bruit, hepatojugular reflux or JVD.     Trachea: Trachea and phonation normal. No tracheal deviation.  Cardiovascular:     Rate and Rhythm: Normal rate and regular rhythm.     Chest Wall: PMI is not  displaced.     Pulses:          Carotid pulses are 2+ on the right side and 2+ on the left side.      Radial pulses are 2+ on the right side and 2+ on the left side.       Femoral pulses are 2+ on the right side and 2+ on the left side.      Popliteal pulses are 2+ on the right side and 2+ on the left side.       Dorsalis pedis pulses are 2+ on the right side and 2+ on the left side.       Posterior tibial pulses are 2+ on the right side and 2+ on the left side.     Heart sounds: Normal heart sounds, S1 normal and S2 normal. No murmur heard.    No systolic murmur is present.     No diastolic murmur is present.     No friction rub. No gallop. No S3 or S4 sounds.  Pulmonary:     Effort: No respiratory distress.     Breath sounds: Normal breath sounds. No decreased breath sounds, wheezing, rhonchi or rales.  Chest:  Breasts:    Breasts are symmetrical.     Right: Normal.     Left: Normal.  Abdominal:     General: Bowel sounds are normal.     Palpations: Abdomen is soft. There is no hepatomegaly, splenomegaly or mass.     Tenderness: There is no abdominal tenderness. There is no guarding or rebound.  Genitourinary:  Penis: Normal and circumcised.      Testes: Normal.     Epididymis:     Right: Normal.     Left: Normal.     Prostate: Normal. Not enlarged, not tender and no nodules present.     Rectum: Normal. Guaiac result negative. No mass.  Musculoskeletal:        General: No tenderness. Normal range of motion.     Cervical back: Normal range of motion and neck supple.     Right lower leg: No edema.     Left lower leg: No edema.  Lymphadenopathy:     Head:     Right side of head: No submental, submandibular or tonsillar adenopathy.     Left side of head: No submental, submandibular or tonsillar adenopathy.     Cervical: No cervical adenopathy.     Right cervical: No superficial, deep or posterior cervical adenopathy.    Left cervical: No superficial, deep or posterior  cervical adenopathy.     Upper Body:     Right upper body: No supraclavicular or axillary adenopathy.     Left upper body: No supraclavicular or axillary adenopathy.  Skin:    General: Skin is warm.     Capillary Refill: Capillary refill takes less than 2 seconds.     Findings: No rash.  Neurological:     Mental Status: He is alert and oriented to person, place, and time.     Cranial Nerves: Cranial nerves 2-12 are intact. No cranial nerve deficit.     Sensory: Sensation is intact.     Motor: Motor function is intact.     Deep Tendon Reflexes: Reflexes are normal and symmetric.     Reflex Scores:      Tricep reflexes are 2+ on the right side and 2+ on the left side.      Bicep reflexes are 2+ on the right side and 2+ on the left side.      Brachioradialis reflexes are 2+ on the right side and 2+ on the left side.      Patellar reflexes are 2+ on the right side and 2+ on the left side. Psychiatric:        Behavior: Behavior is cooperative.     Wt Readings from Last 3 Encounters:  05/18/23 212 lb 12.8 oz (96.5 kg)  12/22/22 213 lb (96.6 kg)  12/07/22 210 lb (95.3 kg)    BP 112/68   Pulse (!) 48   Ht 6' (1.829 m)   Wt 212 lb 12.8 oz (96.5 kg)   SpO2 97%   BMI 28.86 kg/m   Assessment and Plan: Taymar Nivar is a 77 y.o. male who presents today for his Complete Annual Exam. He feels well. He reports exercising walking. He reports he is sleeping fairly well.  Immunizations are reviewed and recommendations provided.   Age appropriate screening tests are discussed. Counseling given for risk factor reduction interventions.   1. Annual physical exam No subjective/objective concerns noted during review of HPI, review of past medical history and labs, review of medications, review of systems, and physical exam.  Will obtain CBC lipid panel and renal function panel.  DRE was done which was normal and PSA will be obtained.  Will recheck for labs at next visit. - CBC with  Differential/Platelet - Lipid Panel With LDL/HDL Ratio - PSA - Renal function panel   Elizabeth Sauer, MD

## 2023-05-19 LAB — RENAL FUNCTION PANEL
Albumin: 3.9 g/dL (ref 3.8–4.8)
BUN/Creatinine Ratio: 17 (ref 10–24)
BUN: 18 mg/dL (ref 8–27)
CO2: 24 mmol/L (ref 20–29)
Calcium: 9.1 mg/dL (ref 8.6–10.2)
Chloride: 98 mmol/L (ref 96–106)
Creatinine, Ser: 1.08 mg/dL (ref 0.76–1.27)
Glucose: 96 mg/dL (ref 70–99)
Phosphorus: 3.4 mg/dL (ref 2.8–4.1)
Potassium: 4.6 mmol/L (ref 3.5–5.2)
Sodium: 136 mmol/L (ref 134–144)
eGFR: 72 mL/min/{1.73_m2} (ref >59)

## 2023-05-19 LAB — CBC WITH DIFFERENTIAL/PLATELET
Basophils Absolute: 0.1 10*3/uL (ref 0.0–0.2)
Basos: 1 %
EOS (ABSOLUTE): 0.3 10*3/uL (ref 0.0–0.4)
Eos: 6 %
Hematocrit: 44.6 % (ref 37.5–51.0)
Hemoglobin: 15.1 g/dL (ref 13.0–17.7)
Immature Grans (Abs): 0 10*3/uL (ref 0.0–0.1)
Immature Granulocytes: 0 %
Lymphocytes Absolute: 1.3 10*3/uL (ref 0.7–3.1)
Lymphs: 29 %
MCH: 33.1 pg — ABNORMAL HIGH (ref 26.6–33.0)
MCHC: 33.9 g/dL (ref 31.5–35.7)
MCV: 98 fL — ABNORMAL HIGH (ref 79–97)
Monocytes Absolute: 0.8 10*3/uL (ref 0.1–0.9)
Monocytes: 17 %
Neutrophils Absolute: 2.1 10*3/uL (ref 1.4–7.0)
Neutrophils: 47 %
Platelets: 218 10*3/uL (ref 150–450)
RBC: 4.56 x10E6/uL (ref 4.14–5.80)
RDW: 12.5 % (ref 11.6–15.4)
WBC: 4.5 10*3/uL (ref 3.4–10.8)

## 2023-05-19 LAB — PSA: Prostate Specific Ag, Serum: 1.1 ng/mL (ref 0.0–4.0)

## 2023-05-19 LAB — LIPID PANEL WITH LDL/HDL RATIO
Cholesterol, Total: 131 mg/dL (ref 100–199)
HDL: 44 mg/dL (ref >39)
LDL Chol Calc (NIH): 73 mg/dL (ref 0–99)
LDL/HDL Ratio: 1.7 {ratio} (ref 0.0–3.6)
Triglycerides: 66 mg/dL (ref 0–149)
VLDL Cholesterol Cal: 14 mg/dL (ref 5–40)

## 2023-05-28 ENCOUNTER — Ambulatory Visit: Payer: Medicare PPO | Admitting: Family Medicine

## 2023-05-28 ENCOUNTER — Encounter: Payer: Self-pay | Admitting: Family Medicine

## 2023-05-28 VITALS — BP 118/70 | HR 50 | Ht 72.0 in | Wt 212.0 lb

## 2023-05-28 DIAGNOSIS — M1711 Unilateral primary osteoarthritis, right knee: Secondary | ICD-10-CM | POA: Insufficient documentation

## 2023-05-28 MED ORDER — NAPROXEN 500 MG PO TABS
500.0000 mg | ORAL_TABLET | Freq: Two times a day (BID) | ORAL | 0 refills | Status: AC | PRN
Start: 2023-05-28 — End: ?

## 2023-05-28 NOTE — Patient Instructions (Signed)
-   Start naproxen twice daily with food until knee symptoms resolve - Then transition to twice daily as-needed dosing of naproxen - Pick up a "wraparound hinged knee brace" and use daily until symptoms resolve, then use as-needed - Contact us for any persistent pain to discuss next steps

## 2023-05-29 NOTE — Assessment & Plan Note (Addendum)
Acute on chronic exacerbation, noted over past few weeks, has had flare in the past which responded to cortisone injection. He has been dosing Aleve which previously had helped with flare but has not responded this time. Exam shows tenderness along the medial joint line primarily No effusion noted as we had previously.   Given relatively reassuring exam his presentation is most consistent with osteoarthritis flare.  Plan: - Start naproxen twice daily with food until knee symptoms resolve - Then transition to twice daily as-needed dosing of naproxen - Pick up a "wraparound hinged knee brace" and use daily until symptoms resolve, then use as-needed - Contact us for any persistent pain to discuss next steps - If recalcitrant symptoms noted, coordinate follow-up with low threshold to proceed with CSI

## 2023-05-29 NOTE — Progress Notes (Signed)
Primary Care / Sports Medicine Office Visit  Patient Information:  Patient ID: Richard Stafford, male DOB: [birth year] Age: 77 y.o. MRN: [id number]   Richard Stafford is a pleasant 77 y.o. male presenting with the following:  Chief Complaint  Patient presents with   Knee Pain    Right knee. Patient was seen a few years ago and had fluid drained, and then cortisone in. Everything got better for several months after this last procedure. Hurts to walk and go up and down stairs. Patient said he may have fluid in it again. Sometimes it feels like it will give out on patient.    Vitals:   05/28/23 0925  BP: 118/70  Pulse: (!) 50  SpO2: 100%   Vitals:   05/28/23 0925  Weight: 212 lb (96.2 kg)  Height: 6' (1.829 m)   Body mass index is 28.75 kg/m.  No results found.   Independent interpretation of notes and tests performed by another provider:   None  Procedures performed:   None  Pertinent History, Exam, Impression, and Recommendations:   Problem List Items Addressed This Visit       Musculoskeletal and Integument   Primary osteoarthritis of right knee - Primary    Acute on chronic exacerbation, noted over past few weeks, has had flare in the past which responded to cortisone injection. He has been dosing Aleve which previously had helped with flare but has not responded this time. Exam shows tenderness along the medial joint line primarily No effusion noted as we had previously.   Given relatively reassuring exam his presentation is most consistent with osteoarthritis flare.  Plan: - Start naproxen twice daily with food until knee symptoms resolve - Then transition to twice daily as-needed dosing of naproxen - Pick up a "wraparound hinged knee brace" and use daily until symptoms resolve, then use as-needed - Contact us for any persistent pain to discuss next steps - If recalcitrant symptoms noted, coordinate follow-up with low threshold to proceed with CSI       Relevant Medications   naproxen (NAPROSYN) 500 MG tablet     Orders & Medications Medications:  Meds ordered this encounter  Medications   naproxen (NAPROSYN) 500 MG tablet    Sig: Take 1 tablet (500 mg total) by mouth 2 (two) times daily as needed.    Dispense:  30 tablet    Refill:  0   No orders of the defined types were placed in this encounter.    No follow-ups on file.     Jerrol Banana, MD, [facility]   Primary Care Sports Medicine Primary Care and Sports Medicine at [facility]

## 2023-06-11 DIAGNOSIS — N401 Enlarged prostate with lower urinary tract symptoms: Secondary | ICD-10-CM | POA: Diagnosis not present

## 2023-06-11 DIAGNOSIS — N138 Other obstructive and reflux uropathy: Secondary | ICD-10-CM | POA: Diagnosis not present

## 2023-06-11 DIAGNOSIS — Z125 Encounter for screening for malignant neoplasm of prostate: Secondary | ICD-10-CM | POA: Diagnosis not present

## 2023-06-11 DIAGNOSIS — R972 Elevated prostate specific antigen [PSA]: Secondary | ICD-10-CM | POA: Diagnosis not present

## 2023-06-18 ENCOUNTER — Encounter: Payer: Self-pay | Admitting: Family Medicine

## 2023-06-18 ENCOUNTER — Ambulatory Visit: Payer: Medicare PPO | Admitting: Family Medicine

## 2023-06-18 VITALS — BP 126/78 | HR 48 | Ht 72.0 in | Wt 212.0 lb

## 2023-06-18 DIAGNOSIS — E039 Hypothyroidism, unspecified: Secondary | ICD-10-CM | POA: Diagnosis not present

## 2023-06-18 NOTE — Progress Notes (Signed)
Date:  06/18/2023   Name:  Richard Stafford   DOB:  11-15-1945   MRN:  [id number]   Chief Complaint: Hypothyroidism  Thyroid Problem Presents for follow-up visit. Patient reports no anxiety, cold intolerance, constipation, depressed mood, diaphoresis, diarrhea, dry skin, fatigue, hair loss, heat intolerance, nail problem, palpitations, weight gain or weight loss. The symptoms have been stable.    Lab Results  Component Value Date   NA 136 05/18/2023   K 4.6 05/18/2023   CO2 24 05/18/2023   GLUCOSE 96 05/18/2023   BUN 18 05/18/2023   CREATININE 1.08 05/18/2023   CALCIUM 9.1 05/18/2023   EGFR 72 05/18/2023   GFRNONAA 69 04/08/2020   Lab Results  Component Value Date   CHOL 131 05/18/2023   HDL 44 05/18/2023   LDLCALC 73 05/18/2023   TRIG 66 05/18/2023   CHOLHDL 3.8 04/04/2018   Lab Results  Component Value Date   TSH 4.600 (H) 12/22/2022   No results found for: "HGBA1C" Lab Results  Component Value Date   WBC 4.5 05/18/2023   HGB 15.1 05/18/2023   HCT 44.6 05/18/2023   MCV 98 (H) 05/18/2023   PLT 218 05/18/2023   Lab Results  Component Value Date   ALT 23 12/22/2022   AST 31 12/22/2022   ALKPHOS 73 12/22/2022   BILITOT 0.6 12/22/2022   No results found for: "25OHVITD2", "25OHVITD3", "VD25OH"   Review of Systems  Constitutional:  Negative for diaphoresis, fatigue, unexpected weight change, weight gain and weight loss.  HENT:  Negative for congestion.   Eyes:  Negative for visual disturbance.  Respiratory:  Negative for chest tightness, shortness of breath and wheezing.   Cardiovascular:  Negative for palpitations.  Gastrointestinal:  Negative for constipation and diarrhea.  Endocrine: Negative for cold intolerance and heat intolerance.  Psychiatric/Behavioral:  The patient is not nervous/anxious.     Patient Active Problem List   Diagnosis Date Noted   Primary osteoarthritis of right knee 05/28/2023   Chronic pain of right knee 04/23/2021   Chronic  pain of left knee 04/23/2021   Incisional hernia, without obstruction or gangrene    Need for hepatitis C screening test 04/02/2017   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   Incomplete emptying of bladder 05/17/2015   Elevated blood pressure, situational 12/21/2014   Routine general medical examination at a health care facility 12/21/2014   Elevation of level of transaminase and lactic acid dehydrogenase (LDH) 12/21/2014   ED (erectile dysfunction) of organic origin 12/21/2014   HLD (hyperlipidemia) 12/21/2014   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   Prostatic hyperplasia, benign localized, with obstruction 05/16/2014   Hx of adenomatous colonic polyps 01/19/2014    No Known Allergies  Past Surgical History:  Procedure Laterality Date   COLONOSCOPY  2014   cleared for 5 years- [facility]   COLONOSCOPY WITH PROPOFOL N/A 04/11/2019   Procedure: COLONOSCOPY WITH PROPOFOL;  Surgeon: Christena Deem, MD;  Location[facility] ENDOSCOPY;  Service: Endoscopy;  Laterality: N/A;   CYST EXCISION     cyst in maxillary sinus removed   HERNIA REPAIR     INCISIONAL HERNIA REPAIR N/A 08/27/2020   Procedure: HERNIA REPAIR INCISIONAL;  Surgeon: Henrene Dodge, MD;  Location: [facility] ORS;  Service: General;  Laterality: N/A;   LAPAROSCOPIC INGUINAL HERNIA REPAIR Right    NASAL ENDOSCOPY Bilateral 01/07/2018   TOOTH EXTRACTION     varicose veins      Social History   Tobacco Use  Smoking status: Former    Current packs/day: 0.00    Average packs/day: 1 pack/day for 4.0 years (4.0 ttl pk-yrs)    Types: Cigarettes    Start date: 52    Quit date: 56    Years since quitting: 36.8   Smokeless tobacco: Never  Vaping Use   Vaping status: Never Used  Substance Use Topics   Alcohol use: Yes    Alcohol/week: 13.0 standard drinks of alcohol    Types: 7 Glasses of wine, 6 Cans of beer per week   Drug use: Never     Medication list has been reviewed and updated.  Current Meds   Medication Sig   Ascorbic Acid (VITAMIN C) 500 MG CAPS Take 500 mg by mouth daily.   aspirin 81 MG tablet Take 81 mg by mouth daily.   calcium carbonate (TUMS - DOSED IN MG ELEMENTAL CALCIUM) 500 MG chewable tablet Chew 1 tablet by mouth daily as needed for indigestion or heartburn.   diphenhydrAMINE (BENADRYL) 25 MG tablet Take 25 mg by mouth every 6 (six) hours as needed. otc   docusate sodium (COLACE) 100 MG capsule Take 100 mg by mouth at bedtime.   finasteride (PROSCAR) 5 MG tablet Take 5 mg by mouth daily. Urology/ [facility]   Iron, Ferrous Sulfate, 325 (65 Fe) MG TABS Take 1 tablet by mouth daily.   levothyroxine (SYNTHROID) 25 MCG tablet TAKE 1 TABLET BY MOUTH AS DIRECTED ON Monday, Wednesday and Friday   levothyroxine (SYNTHROID) 50 MCG tablet TAKE 1 TABLET BY MOUTH ON Tuesday, Thursday, Sat and Sun   Misc Natural Products (GLUCOSAMINE CHONDROITIN TRIPLE) TABS Take 1 tablet by mouth daily.   Multiple Vitamins-Minerals (MENS MULTIVITAMIN PLUS PO) Take 1 tablet by mouth daily.   naproxen (NAPROSYN) 500 MG tablet Take 1 tablet (500 mg total) by mouth 2 (two) times daily as needed.   Omega-3 Fatty Acids (FISH OIL) 1000 MG CAPS Take 1 capsule (1,000 mg total) by mouth daily.   sildenafil (REVATIO) 20 MG tablet Take 20-100 mg by mouth as needed (ED).   sodium chloride (OCEAN) 0.65 % SOLN nasal spray Place 1 spray into both nostrils 2 (two) times daily.       05/18/2023    9:37 AM 12/22/2022    9:48 AM 08/24/2022    8:31 AM 06/23/2022    9:46 AM  GAD 7 : Generalized Anxiety Score  Nervous, Anxious, on Edge 0 0 0 0  Control/stop worrying 0 0 0 0  Worry too much - different things 0 0 0 0  Trouble relaxing 0 0 0 0  Restless 0 0 0 0  Easily annoyed or irritable 0 0 0 0  Afraid - awful might happen 0 0 0 0  Total GAD 7 Score 0 0 0 0  Anxiety Difficulty Not difficult at all Not difficult at all Not difficult at all Not difficult at all       05/18/2023    9:37 AM 12/22/2022    9:47 AM  08/24/2022    8:30 AM  Depression screen PHQ 2/9  Decreased Interest 0 0 0  Down, Depressed, Hopeless 0 0 0  PHQ - 2 Score 0 0 0  Altered sleeping 1 0 0  Tired, decreased energy 0 0 0  Change in appetite 0 0 0  Feeling bad or failure about yourself  0 0 0  Trouble concentrating 0 0 0  Moving slowly or fidgety/restless 0 0 0  Suicidal thoughts 0 0  0  PHQ-9 Score 1 0 0  Difficult doing work/chores Not difficult at all Not difficult at all Not difficult at all    BP Readings from Last 3 Encounters:  06/18/23 126/78  05/28/23 118/70  05/18/23 112/68    Physical Exam Vitals and nursing note reviewed.  HENT:     Head: Normocephalic.     Right Ear: Tympanic membrane and external ear normal.     Left Ear: Tympanic membrane and external ear normal.     Nose: Nose normal.  Eyes:     General: No scleral icterus.       Right eye: No discharge.        Left eye: No discharge.     Conjunctiva/sclera: Conjunctivae normal.     Pupils: Pupils are equal, round, and reactive to light.  Neck:     Thyroid: No thyromegaly.     Vascular: No hepatojugular reflux or JVD.     Trachea: No tracheal deviation.  Cardiovascular:     Rate and Rhythm: Normal rate and regular rhythm.     Heart sounds: Normal heart sounds. No murmur heard.    No friction rub. No gallop.  Pulmonary:     Effort: No respiratory distress.     Breath sounds: Normal breath sounds. No wheezing or rales.  Abdominal:     General: Bowel sounds are normal.     Palpations: Abdomen is soft. There is no hepatomegaly, splenomegaly or mass.  Musculoskeletal:        General: No tenderness. Normal range of motion.     Cervical back: Normal range of motion and neck supple.  Lymphadenopathy:     Cervical: No cervical adenopathy.     Right cervical: No superficial, deep or posterior cervical adenopathy.    Left cervical: No superficial, deep or posterior cervical adenopathy.  Skin:    General: Skin is warm.     Findings: No rash.   Neurological:     Mental Status: He is alert and oriented to person, place, and time.     Cranial Nerves: No cranial nerve deficit.     Deep Tendon Reflexes: Reflexes are normal and symmetric.     Wt Readings from Last 3 Encounters:  06/18/23 212 lb (96.2 kg)  05/28/23 212 lb (96.2 kg)  05/18/23 212 lb 12.8 oz (96.5 kg)    BP 126/78   Pulse (!) 48   Ht 6' (1.829 m)   Wt 212 lb (96.2 kg)   SpO2 98%   BMI 28.75 kg/m   Assessment and Plan: 1. Acquired hypothyroidism Chronic.  Controlled.  Stable.  Asymptomatic on current dosing of levothyroxine TSH has been on the upper limits of normal therefore I have asked him to hold on as making a change on the levothyroxine.  Pending TSH level today.  Will recheck in 6 months.  I suspect my next move will be going up to the next 4 dosing of Synthroid which is 50 and may be skipping a day over the weekend. - TSH     Elizabeth Sauer, MD

## 2023-06-19 ENCOUNTER — Other Ambulatory Visit: Payer: Self-pay | Admitting: Family Medicine

## 2023-06-19 DIAGNOSIS — E039 Hypothyroidism, unspecified: Secondary | ICD-10-CM

## 2023-06-19 LAB — TSH: TSH: 6.52 u[IU]/mL — ABNORMAL HIGH (ref 0.450–4.500)

## 2023-06-19 MED ORDER — LEVOTHYROXINE SODIUM 50 MCG PO TABS
ORAL_TABLET | ORAL | 1 refills | Status: DC
Start: 2023-06-19 — End: 2023-10-19

## 2023-06-25 ENCOUNTER — Ambulatory Visit: Payer: Medicare PPO | Admitting: Family Medicine

## 2023-08-12 ENCOUNTER — Telehealth: Payer: Self-pay

## 2023-08-12 NOTE — Telephone Encounter (Signed)
 I was consulted and agree with early administration of COVID vaccine given age and travel risk.   Fayette Pho, MD 08/12/23  3:14 PM

## 2023-08-12 NOTE — Telephone Encounter (Signed)
 Client traveling to [place] in Feb.  Would like 2nd 24-25 COVID-19 vaccine.  Interval is only at 5months.  Consult with Dr Larita Fife.  Based on travel risk approved 2nd COVID-19 vaccine at 5 months.  Appointment schedule for 1/29

## 2023-08-17 ENCOUNTER — Other Ambulatory Visit: Payer: Self-pay

## 2023-08-17 MED ORDER — AZITHROMYCIN 500 MG PO TABS
ORAL_TABLET | ORAL | 0 refills | Status: DC
  Filled 2023-08-17: qty 4, 3d supply, fill #0

## 2023-08-30 ENCOUNTER — Other Ambulatory Visit: Payer: Self-pay | Admitting: Family Medicine

## 2023-08-30 DIAGNOSIS — D509 Iron deficiency anemia, unspecified: Secondary | ICD-10-CM

## 2023-08-30 NOTE — Telephone Encounter (Signed)
Requested medication (s) are due for refill today: yes  Requested medication (s) are on the active medication list: yes  Last refill:  12/22/22 #90 1 refills   Future visit scheduled: no   Notes to clinic:  protocol failed last lab 04/08/20. Do you want to refill Rx?     Requested Prescriptions  Pending Prescriptions Disp Refills   FEROSUL 325 (65 Fe) MG tablet [Pharmacy Med Name: FEROSUL 325MG  TABLET] 90 tablet 1    Sig: TAKE ONE (1) TABLET BY MOUTH DAILY.     Endocrinology:  Minerals - Iron Supplementation Failed - 08/30/2023  4:27 PM      Failed - Fe (serum) in normal range and within 360 days    No results found for: "IRON", "IRONPCTSAT"       Failed - Ferritin in normal range and within 360 days    Ferritin  Date Value Ref Range Status  04/08/2020 14 (L) 30 - 400 ng/mL Final         Passed - HGB in normal range and within 360 days    Hemoglobin  Date Value Ref Range Status  05/18/2023 15.1 13.0 - 17.7 g/dL Final         Passed - HCT in normal range and within 360 days    Hematocrit  Date Value Ref Range Status  05/18/2023 44.6 37.5 - 51.0 % Final         Passed - RBC in normal range and within 360 days    RBC  Date Value Ref Range Status  05/18/2023 4.56 4.14 - 5.80 x10E6/uL Final         Passed - Valid encounter within last 12 months    Recent Outpatient Visits           2 months ago Acquired hypothyroidism   [facility]   3 months ago Primary osteoarthritis of right knee   [place] [facility], Ocie Bob, MD   3 months ago Annual physical exam   [facility], [facility]   8 months ago Iron deficiency anemia, unspecified iron deficiency anemia type   [facility], [facility]   1 year ago Hypothyroidism due to acquired  atrophy of thyroid   [facility], MD

## 2023-09-07 ENCOUNTER — Ambulatory Visit: Payer: Medicare PPO

## 2023-09-08 ENCOUNTER — Ambulatory Visit: Payer: Medicare PPO

## 2023-09-08 DIAGNOSIS — Z719 Counseling, unspecified: Secondary | ICD-10-CM

## 2023-09-08 DIAGNOSIS — Z23 Encounter for immunization: Secondary | ICD-10-CM | POA: Diagnosis not present

## 2023-09-08 NOTE — Progress Notes (Addendum)
Pt in Nurse Clinic with wife requesting Pfizer need for travelling. Pt received last covid vaccine on 04/07/23 per [facility]. Ok to receive covid vaccine before 6 months per Dr. Larita Fife dated 08/12/23, see Epic note. Administered Pfizer Comirnaty 12y+, yr 2024-2025, monitored for 15 minutes without problems. Given VIS, and an updated [facility] copies, explained and understood. M.Lamoyne Palencia, LPN.

## 2023-09-10 DIAGNOSIS — Z0100 Encounter for examination of eyes and vision without abnormal findings: Secondary | ICD-10-CM | POA: Diagnosis not present

## 2023-09-10 DIAGNOSIS — H2513 Age-related nuclear cataract, bilateral: Secondary | ICD-10-CM | POA: Diagnosis not present

## 2023-09-10 DIAGNOSIS — H52223 Regular astigmatism, bilateral: Secondary | ICD-10-CM | POA: Diagnosis not present

## 2023-10-18 ENCOUNTER — Other Ambulatory Visit: Payer: Self-pay | Admitting: Family Medicine

## 2023-10-18 DIAGNOSIS — E039 Hypothyroidism, unspecified: Secondary | ICD-10-CM

## 2023-10-18 NOTE — Telephone Encounter (Signed)
 Copied from CRM [id number]. Topic: Clinical - Medication Refill >> Oct 18, 2023 10:18 AM Martha Clan wrote: Most Recent Primary Care Visit:  Provider: Duanne Limerick  Department: [facility]  Visit Type: OFFICE VISIT  Date: 06/18/2023  Medication: levothyroxine (SYNTHROID) 50 MCG tablet [[id number]]  Has the patient contacted their pharmacy? Yes (Agent: If no, request that the patient contact the pharmacy for the refill. If patient does not wish to contact the pharmacy document the reason why and proceed with request.) (Agent: If yes, when and what did the pharmacy advise?)  Is this the correct pharmacy for this prescription? Yes If no, delete pharmacy and type the correct one.  This is the patient's preferred pharmacy:  [facility] - [place], [place] - [address] [place] [address] Phone: [phone] Fax: [phone]   Has the prescription been filled recently? No  Is the patient out of the medication? Yes  Has the patient been seen for an appointment in the last year OR does the patient have an upcoming appointment? Yes  Can we respond through MyChart? No  Agent: Please be advised that Rx refills may take up to 3 business days. We ask that you follow-up with your pharmacy.

## 2023-10-19 ENCOUNTER — Other Ambulatory Visit: Payer: Self-pay | Admitting: Family Medicine

## 2023-10-19 DIAGNOSIS — E039 Hypothyroidism, unspecified: Secondary | ICD-10-CM

## 2023-10-19 MED ORDER — LEVOTHYROXINE SODIUM 50 MCG PO TABS: ORAL_TABLET | ORAL | 1 refills | Status: DC

## 2024-01-17 ENCOUNTER — Other Ambulatory Visit: Payer: Self-pay

## 2024-01-17 DIAGNOSIS — E039 Hypothyroidism, unspecified: Secondary | ICD-10-CM

## 2024-01-17 MED ORDER — LEVOTHYROXINE SODIUM 50 MCG PO TABS: ORAL_TABLET | ORAL | 0 refills | Status: AC

## 2024-04-17 ENCOUNTER — Ambulatory Visit

## 2024-08-22 ENCOUNTER — Encounter: Payer: Self-pay | Admitting: Family Medicine

## 2024-08-22 ENCOUNTER — Ambulatory Visit: Admitting: Family Medicine

## 2024-08-22 ENCOUNTER — Other Ambulatory Visit (INDEPENDENT_AMBULATORY_CARE_PROVIDER_SITE_OTHER): Admitting: Radiology

## 2024-08-22 VITALS — BP 112/60 | HR 64 | Ht 72.0 in | Wt 217.0 lb

## 2024-08-22 DIAGNOSIS — S76112A Strain of left quadriceps muscle, fascia and tendon, initial encounter: Secondary | ICD-10-CM

## 2024-08-22 DIAGNOSIS — M76892 Other specified enthesopathies of left lower limb, excluding foot: Secondary | ICD-10-CM

## 2024-08-22 DIAGNOSIS — M1712 Unilateral primary osteoarthritis, left knee: Secondary | ICD-10-CM

## 2024-08-22 DIAGNOSIS — M25462 Effusion, left knee: Secondary | ICD-10-CM

## 2024-08-22 MED ORDER — BACLOFEN 5 MG PO TABS: 1.0000 | ORAL_TABLET | Freq: Three times a day (TID) | ORAL | 0 refills | Status: AC | PRN

## 2024-08-22 MED ORDER — MELOXICAM 15 MG PO TABS: 15.0000 mg | ORAL_TABLET | Freq: Every day | ORAL | 0 refills | Status: AC

## 2024-08-22 MED ORDER — METHYLPREDNISOLONE ACETATE 40 MG/ML IJ SUSP
40.0000 mg | Freq: Once | INTRAMUSCULAR | Status: AC
  Administered 2024-08-22: 40 mg via INTRA_ARTICULAR

## 2024-08-22 NOTE — Assessment & Plan Note (Addendum)
" °  Orders:   US  LIMITED JOINT SPACE STRUCTURES LOW LEFT; Future  "

## 2024-08-22 NOTE — Progress Notes (Signed)
 "    Primary Care / Sports Medicine Office Visit  Patient Information:  Patient ID: Richard Stafford, male DOB: 12-12-1945 Age: 79 y.o. MRN: [id number]   Richard Stafford is a pleasant 79 y.o. male presenting with the following:  Chief Complaint  Patient presents with   Knee Pain    Left knee pain right above knee cap with a knot. Dull ache. Crossing leg and pressure applied to are causes pain.     Vitals:   08/22/24 1452  BP: 112/60  Pulse: 64  SpO2: 98%   Vitals:   08/22/24 1452  Weight: 217 lb (98.4 kg)  Height: 6' (1.829 m)   Body mass index is 29.43 kg/m.  No results found.   Independent interpretation of notes and tests performed by another provider:   None  Procedures performed:   Procedure:  Injection of left knee under ultrasound guidance. Ultrasound guidance utilized for anterolateral approach Samsung HS60 device utilized with permanent recording / reporting. Verbal informed consent obtained and verified. Skin prepped in a sterile fashion. Ethyl chloride for topical local analgesia.  Completed without difficulty and tolerated well. Medication: triamcinolone  acetonide 40 mg/mL suspension for injection 1 mL total and 2 mL lidocaine  1% without epinephrine  utilized for needle placement anesthetic Advised to contact for fevers/chills, erythema, induration, drainage, or persistent bleeding.   Pertinent History, Exam, Impression, and Recommendations:   Discussed the use of AI scribe software for clinical note transcription with the patient, who gave verbal consent to proceed.  History of Present Illness   [name] is a 79 year old male with prior right knee meniscus injury who presents with acute left knee pain and swelling.  Left Knee Pain and Swelling: - Acute onset two days prior to presentation - Localized above the knee with palpable fullness and visible bump - No history of trauma, direct injury, or ecchymosis - Onset followed increased stair  climbing at home - Pain exacerbated by movement, especially knee extension and leg elevation - Soreness with pressure - Persistent pain with difficulty moving the leg - Disrupted sleep due to pain, with worsening sleep the night before presentation - Able to lift the leg and maintain function despite pain  Symptom Management: - Initiated prescription-strength naproxen  sodium since symptom onset - Partial analgesia achieved, but no reduction in swelling  Right Knee History: - Prior right knee meniscus injury with interventions in September 2022 and October 2024 - Right knee currently asymptomatic       Assessment and Plan    Left quadriceps muscle strain with tendinitis and left knee effusion Acute left quadriceps strain with tendinitis and knee effusion. Ultrasound showed partial quadriceps tear with muscle retraction. Function preserved, conservative management expected to achieve recovery. Surgery not indicated. - Performed intra-articular corticosteroid injection to left knee for relief. - Prescribed meloxicam  15 mg daily with food for one week, then as needed, replacing naproxen . - Prescribed baclofen  up to three times daily as needed for muscle tightness, advised on drowsiness and precautions with driving or machinery for eight hours post-dose. - Provided home exercise program to start post-acute pain, focusing on quadriceps flexibility and strengthening. - Advised to avoid painful activities stressing quadriceps; normal activity as tolerated, guided by symptoms. - Explained possible persistent muscle contour change due to retraction, with functional recovery expected. - Directed to pick up exercise instructions at clinic station post-visit.        Assessment & Plan Effusion, left knee  Orders:   US  LIMITED JOINT  SPACE STRUCTURES LOW LEFT; Future  Primary osteoarthritis of left knee  Orders:   US  LIMITED JOINT SPACE STRUCTURES LOW LEFT; Future   methylPREDNISolone   acetate (DEPO-MEDROL ) injection 40 mg  Tendinitis of left quadriceps tendon  Orders:   US  LIMITED JOINT SPACE STRUCTURES LOW LEFT; Future  Quadriceps strain, left, initial encounter  Orders:   US  LIMITED JOINT SPACE STRUCTURES LOW LEFT; Future   meloxicam  (MOBIC ) 15 MG tablet; Take 1 tablet (15 mg total) by mouth daily. X 1 week then daily prn   Baclofen  5 MG TABS; Take 1 tablet (5 mg total) by mouth 3 (three) times daily as needed.   No follow-ups on file.     Selinda JINNY Ku, MD, [facility]   Primary Care Sports Medicine Primary Care and Sports Medicine at [facility]   "

## 2024-08-22 NOTE — Assessment & Plan Note (Addendum)
" °  Orders:   US  LIMITED JOINT SPACE STRUCTURES LOW LEFT; Future   methylPREDNISolone  acetate (DEPO-MEDROL ) injection 40 mg  "

## 2024-08-22 NOTE — Patient Instructions (Addendum)
" °  VISIT SUMMARY: During your visit, we addressed your acute left knee pain and swelling, which was diagnosed as a left quadriceps muscle strain with tendinitis and knee effusion. An ultrasound confirmed a partial quadriceps tear with muscle retraction. We have outlined a conservative management plan to help you recover.  YOUR PLAN: LEFT KNEE PAIN AND SWELLING: You have a left quadriceps muscle strain with tendinitis and knee effusion, confirmed by ultrasound showing a partial quadriceps tear with muscle retraction. -We performed an intra-articular corticosteroid injection to your left knee for relief. -You are prescribed meloxicam  15 mg daily with food for one week, then as needed, replacing naproxen . -You are prescribed baclofen  up to three times daily as needed for muscle tightness. Be cautious of drowsiness and avoid driving or operating machinery for eight hours after taking it. -Start the home exercise program provided after the acute pain subsides, focusing on quadriceps flexibility and strengthening. -Avoid activities that cause pain and stress to your quadriceps. Engage in normal activities as tolerated, guided by your symptoms. -There may be a persistent change in muscle contour due to retraction, but functional recovery is expected. -Pick up the exercise instructions at the clinic station post-visit.                      Contains text generated by Abridge.                                 Contains text generated by Abridge.   "

## 2024-09-14 ENCOUNTER — Other Ambulatory Visit: Payer: Self-pay | Admitting: Radiology

## 2024-09-14 ENCOUNTER — Encounter: Payer: Self-pay | Admitting: Family Medicine

## 2024-09-14 ENCOUNTER — Ambulatory Visit: Admitting: Family Medicine

## 2024-09-14 VITALS — BP 92/58 | HR 56 | Ht 72.0 in | Wt 214.0 lb

## 2024-09-14 DIAGNOSIS — M1711 Unilateral primary osteoarthritis, right knee: Secondary | ICD-10-CM

## 2024-09-14 MED ORDER — METHYLPREDNISOLONE ACETATE 40 MG/ML IJ SUSP
40.0000 mg | Freq: Once | INTRAMUSCULAR | Status: AC
  Administered 2024-09-14: 40 mg via INTRA_ARTICULAR

## 2024-09-14 NOTE — Assessment & Plan Note (Signed)
" °  Orders:   methylPREDNISolone  acetate (DEPO-MEDROL ) injection 40 mg   US  LIMITED JOINT SPACE STRUCTURES LOW RIGHT; Future  "

## 2024-09-14 NOTE — Patient Instructions (Signed)
" °  VISIT SUMMARY: Today, you were seen for follow-up of your left quadriceps tendonitis and right knee osteoarthritis. We discussed your ongoing right knee pain and recent resolution of left knee pain.  YOUR PLAN: RIGHT KNEE PAIN AND DYSFUNCTION: You have chronic osteoarthritis in your right knee with recent increased pain due to activity. -You received a corticosteroid injection in your right knee today to help with the pain. This should provide relief for several months. -Use meloxicam  or naproxen  for any breakthrough pain until the injection takes effect. You have an adequate supply of these medications. -Avoid strenuous activity for two days after the injection and limit yourself to normal activities. -Ice your right knee for 20 minutes twice today. -Make sure you have enough medication for your upcoming international travel. -Contact our office if your symptoms do not improve within two weeks or if they worsen. -No routine follow-up is necessary unless your symptoms recur or persist.  LEFT KNEE PAIN (QUADRICEPS TENDONITIS): Your left knee pain has resolved completely since your last visit and you no longer need medication for it. -No further treatment is needed for your left knee at this time.    Contains text generated by Abridge.   "

## 2024-09-14 NOTE — Progress Notes (Signed)
 "    Primary Care / Sports Medicine Office Visit  Patient Information:  Patient ID: Richard Stafford, male DOB: 09-07-1945 Age: 79 y.o. MRN: [id number]   Richard Stafford is a pleasant 79 y.o. male presenting with the following:  Chief Complaint  Patient presents with   Procedure    Right knee cortisone injection evaluation.    Vitals:   09/14/24 1307  BP: (!) 92/58  Pulse: (!) 56  SpO2: 97%   Vitals:   09/14/24 1307  Weight: 214 lb (97.1 kg)  Height: 6' (1.829 m)   Body mass index is 29.02 kg/m.     Independent interpretation of notes and tests performed by another provider:   None  Procedures performed:   Procedure:  Injection of right knee under ultrasound guidance. Ultrasound guidance utilized for anteromedial approach, joint space visualized Samsung HS60 device utilized with permanent recording / reporting. Verbal informed consent obtained and verified. Skin prepped in a sterile fashion. Ethyl chloride for topical local analgesia.  Completed without difficulty and tolerated well. Medication: triamcinolone  acetonide 40 mg/mL suspension for injection 1 mL total and 2 mL lidocaine  1% without epinephrine  utilized for needle placement anesthetic Advised to contact for fevers/chills, erythema, induration, drainage, or persistent bleeding.   Pertinent History, Exam, Impression, and Recommendations:   Discussed the use of AI scribe software for clinical note transcription with the patient, who gave verbal consent to proceed.  History of Present Illness   Richard Stafford is a 79 year old male with bilateral knee pain who presents for follow-up of left quadriceps tendonitis and right knee osteoarthritis.  Right Knee Pain and Dysfunction: - Persistent right knee discomfort with a history of meniscal injury - Prior interventions include joint aspiration and corticosteroid injection in September 2022 and October 2024; no surgical management - Intermittent pain with  activity; not severe at rest - Increased right knee pain during period of left knee symptoms, resulting in complete avoidance of stairs for approximately one week - Currently avoids stairs and prefers elevator use due to pain - Uses meloxicam  from prior prescriptions and naproxen  as needed for knee pain  Left Knee Pain (Quadriceps Tendonitis): - Last seen on January 13th, 2026 for left knee pain localized to quadriceps and patellar region - Received intra-articular injection at that visit - Complete resolution of left-sided symptoms since injection - Gradual subsidence of previously palpable bump and soreness - Discontinued meloxicam  after approximately one week - No recurrence of pain or need for further medication for the left knee  Functional Status and Activity: [place] - Recently performed snow and ice management with tractor and shovel - Planning a 20-day international cruise in mid-February - Ensured adequate supply of anti-inflammatory medication for travel       Assessment and Plan    Primary osteoarthritis of right knee Chronic primary osteoarthritis with recent symptomatic exacerbation due to degenerative changes and increased activity. Managed previously with joint aspiration and corticosteroid injection. - Performed right knee intra-articular corticosteroid injection for symptom relief, expected to last several months. - Advised use of meloxicam  or naproxen  for breakthrough pain until injection takes effect; confirmed adequate supply. - Instructed to avoid strenuous activity for two days post-injection and limit to normal activity. - Recommended icing the right knee for 20 minutes twice today. - Provided anticipatory guidance regarding medication availability during upcoming international travel. - Advised to contact office if symptoms do not improve within two weeks or worsen. -  No routine follow-up necessary  unless symptoms recur or persist.        Assessment & Plan Primary osteoarthritis of right knee  Orders:   methylPREDNISolone  acetate (DEPO-MEDROL ) injection 40 mg   US  LIMITED JOINT SPACE STRUCTURES LOW RIGHT; Future   No follow-ups on file.     Selinda JINNY Ku, MD, [facility]   Primary Care Sports Medicine Primary Care and Sports Medicine at [facility]   "
